# Patient Record
Sex: Female | Born: 1965 | Race: White | Hispanic: No | Smoking: Never smoker
Health system: Southern US, Community
[De-identification: ages and names within clinical notes are randomized; demographics above are authoritative.]

## PROBLEM LIST (undated history)

## (undated) DIAGNOSIS — T39395A Adverse effect of other nonsteroidal anti-inflammatory drugs [NSAID], initial encounter: Secondary | ICD-10-CM

## (undated) DIAGNOSIS — E669 Obesity, unspecified: Secondary | ICD-10-CM

## (undated) DIAGNOSIS — K259 Gastric ulcer, unspecified as acute or chronic, without hemorrhage or perforation: Secondary | ICD-10-CM

## (undated) DIAGNOSIS — N200 Calculus of kidney: Secondary | ICD-10-CM

## (undated) DIAGNOSIS — E039 Hypothyroidism, unspecified: Secondary | ICD-10-CM

## (undated) DIAGNOSIS — H9190 Unspecified hearing loss, unspecified ear: Secondary | ICD-10-CM

## (undated) DIAGNOSIS — K589 Irritable bowel syndrome without diarrhea: Secondary | ICD-10-CM

## (undated) DIAGNOSIS — Z974 Presence of external hearing-aid: Secondary | ICD-10-CM

## (undated) HISTORY — DX: Presence of external hearing-aid: Z97.4

## (undated) HISTORY — PX: TUBAL LIGATION: SHX77

## (undated) HISTORY — PX: INNER EAR SURGERY: SHX679

## (undated) HISTORY — DX: Obesity, unspecified: E66.9

## (undated) HISTORY — PX: CHOLECYSTECTOMY: SHX55

---

## 1999-07-04 ENCOUNTER — Emergency Department (HOSPITAL_COMMUNITY): Admission: EM | Admit: 1999-07-04 | Discharge: 1999-07-04 | Payer: Self-pay | Admitting: Emergency Medicine

## 1999-08-14 ENCOUNTER — Emergency Department (HOSPITAL_COMMUNITY): Admission: EM | Admit: 1999-08-14 | Discharge: 1999-08-14 | Payer: Self-pay | Admitting: Emergency Medicine

## 1999-10-23 ENCOUNTER — Encounter: Payer: Self-pay | Admitting: *Deleted

## 1999-10-23 ENCOUNTER — Emergency Department (HOSPITAL_COMMUNITY): Admission: EM | Admit: 1999-10-23 | Discharge: 1999-10-23 | Payer: Self-pay | Admitting: Emergency Medicine

## 2001-07-19 ENCOUNTER — Encounter: Payer: Self-pay | Admitting: Emergency Medicine

## 2001-07-19 ENCOUNTER — Emergency Department (HOSPITAL_COMMUNITY): Admission: EM | Admit: 2001-07-19 | Discharge: 2001-07-19 | Payer: Self-pay | Admitting: Emergency Medicine

## 2002-02-19 ENCOUNTER — Encounter: Payer: Self-pay | Admitting: Emergency Medicine

## 2002-02-19 ENCOUNTER — Emergency Department (HOSPITAL_COMMUNITY): Admission: EM | Admit: 2002-02-19 | Discharge: 2002-02-19 | Payer: Self-pay | Admitting: Emergency Medicine

## 2002-11-27 ENCOUNTER — Encounter: Payer: Self-pay | Admitting: Emergency Medicine

## 2002-11-28 ENCOUNTER — Inpatient Hospital Stay (HOSPITAL_COMMUNITY): Admission: EM | Admit: 2002-11-28 | Discharge: 2002-12-01 | Payer: Self-pay | Admitting: Emergency Medicine

## 2002-11-28 ENCOUNTER — Encounter: Payer: Self-pay | Admitting: Endocrinology

## 2002-11-30 ENCOUNTER — Encounter: Payer: Self-pay | Admitting: Internal Medicine

## 2002-12-19 ENCOUNTER — Emergency Department (HOSPITAL_COMMUNITY): Admission: EM | Admit: 2002-12-19 | Discharge: 2002-12-19 | Payer: Self-pay | Admitting: Emergency Medicine

## 2002-12-19 ENCOUNTER — Encounter: Payer: Self-pay | Admitting: Emergency Medicine

## 2003-02-13 ENCOUNTER — Emergency Department (HOSPITAL_COMMUNITY): Admission: EM | Admit: 2003-02-13 | Discharge: 2003-02-13 | Payer: Self-pay | Admitting: Emergency Medicine

## 2003-10-06 ENCOUNTER — Emergency Department (HOSPITAL_COMMUNITY): Admission: EM | Admit: 2003-10-06 | Discharge: 2003-10-07 | Payer: Self-pay | Admitting: Emergency Medicine

## 2004-02-02 ENCOUNTER — Observation Stay (HOSPITAL_COMMUNITY): Admission: EM | Admit: 2004-02-02 | Discharge: 2004-02-03 | Payer: Self-pay | Admitting: *Deleted

## 2005-01-07 ENCOUNTER — Emergency Department (HOSPITAL_COMMUNITY): Admission: EM | Admit: 2005-01-07 | Discharge: 2005-01-07 | Payer: Self-pay | Admitting: *Deleted

## 2005-08-14 ENCOUNTER — Ambulatory Visit: Payer: Self-pay

## 2008-08-13 ENCOUNTER — Ambulatory Visit: Payer: Self-pay | Admitting: Interventional Radiology

## 2008-08-13 ENCOUNTER — Emergency Department (HOSPITAL_BASED_OUTPATIENT_CLINIC_OR_DEPARTMENT_OTHER): Admission: EM | Admit: 2008-08-13 | Discharge: 2008-08-13 | Payer: Self-pay | Admitting: Emergency Medicine

## 2009-02-28 ENCOUNTER — Emergency Department (HOSPITAL_BASED_OUTPATIENT_CLINIC_OR_DEPARTMENT_OTHER): Admission: EM | Admit: 2009-02-28 | Discharge: 2009-02-28 | Payer: Self-pay | Admitting: Emergency Medicine

## 2009-02-28 ENCOUNTER — Ambulatory Visit: Payer: Self-pay | Admitting: Diagnostic Radiology

## 2010-10-01 ENCOUNTER — Encounter: Payer: Self-pay | Admitting: Orthopedic Surgery

## 2011-01-26 NOTE — H&P (Signed)
NAME:  Kelli Allison, Kelli Allison NO.:  [id number]   MEDICAL RECORD NO.:  [id number]                   PATIENT TYPE:  INP   LOCATION:  5725                                 FACILITY:  [facility]   PHYSICIAN:  Lemmie Evens, M.D.             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 for evaluation of difficulty voiding urine,  nausea, vomiting, and right flank pain of one to two days duration.   The patient, who has been followed by Dr. Carney Living in [facility], came to the emergency room for evaluation of the previously  mentioned complaints.  She did call her primary care physician a day ago who  prescribed Macrobid.  She did take one dose only.   In the emergency room this evening, the patient was found to have evidence  of pyuria on urinalysis.  Also the CBC did reveal a white cell count of  12,100.  Her temperature was 98.5 orally.  The patient has vomited at least  once in the last 24 hours.  She does complain of lower abdominal pain,  mainly in the suprapubic area.  In addition, she has had difficulty voiding  urine and had to be catheterized in the emergency room to obtain a specimen.   The patient denies headache, dizziness, blurred vision, chest pain, cough,  shortness of breath, recurrent diarrhea, peripheral numbness, or joint  discomfort.  She has had a longstanding history of constipation and does use  an enema as needed as well as a stool softener.  Also the patient has been  treated for migraine headaches. She does use amitriptyline 4 mg before bed.   The patient has undergone a tubal ligation procedure.  Also she has had C-  sections for delivery of two children.   Also she has undergone nine  operative procedures for correction of a hearing problem.   ALLERGIES:  The patient has had no significant allergy to medicine.   SOCIAL HISTORY:  The patient does not smoke cigarettes or drink alcohol.   PHYSICAL EXAMINATION:  GENERAL:  The patient appears to be acutely ill.  VITAL SIGNS:  Temperature is 98.5, blood pressure is 120/82, respirations  were 20.  EXTREMITIES:  Reveal good hand grip bilaterally.  There is no swelling of  the peripheral joints and she can move arms and legs well.  SKIN:  No rash in the trunk, face, or extremities.  HEENT:  Mouth exam reveals dryness of the buccal mucosa.  NECK:  No thyromegaly or adenopathy.  LUNGS:  Clear breath sounds bilaterally without rales.  HEART:  Regular sinus rhythm.  Heart  rate of 90.  No murmurs, rubs, or  gallops is appreciated.  ABDOMEN:  Good bowel sounds throughout.  There is tenderness on palpation in  the suprapubic area.  There is no tenderness in the left or right upper  quadrants of abdomen.  There is marked tenderness in the right CVA area of  the back.  On deep palpation, there is tenderness in the area of the right  kidney.    ASSESSMENT:  The patient has had a history of difficulty with urination,  vomiting, nausea, and suprapubic pain, as well as right flank pain for the  past 24 to 48 hours.  Urinalysis does reveal evidence of pyuria.  She is  being admitted for treatment of a pyelonephritis.  Because she has a seen a  physician outside the area, she is being admitted under unassigned service  to Dr. Georgianne Fick.                                               Lemmie Evens, M.D.    [place] Pink  D:  11/28/2002  T:  11/29/2002  Job:  [id number]

## 2011-01-26 NOTE — H&P (Signed)
NAME:  Kelli Allison, Kelli Allison                         ACCOUNT NO.:  [id number]   MEDICAL RECORD NO.:  [id number]                   PATIENT TYPE:  INP   LOCATION:  IC07                                 FACILITY:  [facility]   PHYSICIAN:  Tesfaye D. Felecia Shelling, M.D.              DATE OF BIRTH:  Jan 19, 1966   DATE OF ADMISSION:  02/02/2004  DATE OF DISCHARGE:                                HISTORY & PHYSICAL   CHIEF COMPLAINT:  Alcohol intoxication.   HISTORY OF PRESENT ILLNESS:  This is a 45 year old female patient who was  brought to the emergency room by [facility] police due to alcohol  intoxication.  Patient was drinking alcohol excessively, and she became  unresponsive.  She was brought to the emergency room, where she was  evaluated.  Patient had a blood alcohol level of 277.  Patient is currently  unresponsive and unable to give any history.  There is no further history  available.   REVIEW OF SYSTEMS:  Currently unresponsive and unable to give any history.   PAST MEDICAL HISTORY:  Reviewing her previous record, it shows that the  patient was admitted previously for urosepsis.  She has been treated in the  hospital with IV antibiotics.   CURRENT MEDICATIONS:  Patient is not on any known medications.   SOCIAL HISTORY:  No history is available at this time.   PHYSICAL EXAMINATION:  VITAL SIGNS:  Blood pressure 104/69, pulse 77,  respiratory rate 16, temperature 97.4 degrees Fahrenheit.  GENERAL:  Patient is deeply sleepy, and she is not responding to verbal  communication.  HEENT:  Pupils are equal and reactive.  NECK:  Supple.  LUNGS:  Clear lung fields.  Good air entry.  HEART:  First and second heart sounds heard.  No murmur or gallop.  ABDOMEN:  Soft and lax.  Bowel sounds are positive.  No mass or  hepatosplenomegaly.  EXTREMITIES:  No leg edema.   LABS:  WBC 8.2, hemoglobin 14.3, hematocrit 40.5, platelets 252.  Sodium  138, potassium 3.2, chloride 104, carbon dioxide 27, glucose 145,  BUN 8,  creatinine 0.7, calcium 8.0.  Alcohol 277.   ASSESSMENT:  1. Alcohol intoxication.  2. Mild hypokalemia.   PLAN:  Will keep the patient on close observation.  Continue IV fluids.  Will supplement potassium.  Will get behavioral health for patient  counseling and further management.     ___________________________________________                                         Eustaquio Maize. Felecia Shelling, M.D.   TDF/MEDQ  D:  02/02/2004  T:  02/02/2004  Job:  [id number]

## 2011-01-26 NOTE — Discharge Summary (Signed)
NAME:  Kelli Allison, Kelli Allison                         ACCOUNT NO.:  [id number]   MEDICAL RECORD NO.:  [id number]                   PATIENT TYPE:  INP   LOCATION:  5725                                 FACILITY:  [facility]   PHYSICIAN:  Georgianne Fick, M.D.            DATE OF BIRTH:  December 20, 1965   DATE OF ADMISSION:  11/28/2002  DATE OF DISCHARGE:  12/01/2002                                 DISCHARGE SUMMARY   ADMISSION DIAGNOSES:  1. Urinary tract infection.  2. Proteinuria.  3. Rule out pyelonephritis.  4. Abdominal pain of uncertain etiology.  5. Nausea and vomiting.   DISCHARGE DIAGNOSES:  1. Urinary tract infection.  2. No pyelonephritis.  3. Abdominal pain probably secondary to urinary tract infection, although     multiple tests have been negative for any other etiology.  The patient     did require some high doses of narcotics and we have reduced these     significantly as my concern is that of developing a dependence in this     patient whose symptoms do not truly match her objective findings.  We     will discharge her on only a minimal amounts of narcotics, to further     follow up with her primary M.D.   HOSPITAL COURSE:  After being admitted to [facility] with  complaints of severe nausea, vomiting and abdominal pain, the patient, who  was diagnosed with a urinary tract infection, was ruled out for  pyelonephritis by CAT scan as well as by ultrasound.  She was treated with  IV ciprofloxacin with improvement in her condition.  She presently has  minimal pain and tenderness and will thus be discharged.   During her stay, the nausea was controlled adequately with the use of  Phenergan as well as with the alteration of her diet.  Her pain was treated,  initially requiring morphine 2 mg IV every two hours; with this, the patient  obtained pain relief without significant sedation.  She was weaned off of  this and presently is stable and has been stable overnight with  just  Percocet and we will discharge her on Darvocet for home.  Her nausea is only  minor on full diet.   LABORATORY INVESTIGATIONS:  BMP revealed sodium of 140 with potassium of  3.4, BUN of 2, creatinine of 0.9.  CBC reveals white count of 5.5 with a  hemoglobin of 12.5, hematocrit of 35.7.  Urinalysis taken on admission  revealed wbc's that were too numerous to count, rbc's which were 3 to 6 per  high power field.  Urinalysis revealed greater than 300 protein, negative  nitrite, positive leukocytes and turbid urine.  Urine culture revealed E.  coli, sensitive to ampicillin, Cipro, cefazolin, gentamicin, Bactrim, and  fairly all other antibiotics were sensitive to it.   RADIOLOGICAL INVESTIGATIONS:  Abdominal x-ray taken on November 30, 2002  reveals nonobstructive bowel gas  pattern, no acute abdominal pathology,  right basilar atelectasis.   Ultrasound of the abdomen and kidneys reveals normal renal ultrasound.  There is no evidence of abscess.  This was taken on November 30, 2002.   CT of the pelvis taken on November 28, 2002 revealed normal abdominal  ultrasound and normal pelvic ultrasound.   Chest x-ray taken on November 28, 2002 revealed mild bronchitic changes and no  acute abnormalities.   DISCHARGE MEDICATIONS:  1. The patient will continue all previous pre-admission medications.  2. Cipro 500 mg p.o. b.i.d. for 10 more days.  3. Phenergan 25 mg one tablet p.o. p.r.n. q.4-6h.  4. Darvocet-N 100 one tablet p.o. p.r.n. q.6h. for pain.  5. Bowel regimen using stool softeners such as Colace with daily laxative     such as Metamucil.   DISCHARGE INSTRUCTIONS:  1. The patient should follow up with her primary M.D.  2. Low-fat diet.  3. Again, I want to reiterate that I am concerned that we will induce an     addiction in this individual and because of that concern, I have limited     her narcotic use.  She has no further refills on her Darvocet and she     will thus be turned over to  her primary physician.                                               Georgianne Fick, M.D.    AR/MEDQ  D:  12/01/2002  T:  12/02/2002  Job:  [id number]   cc:   Dr. Jyl Heinz

## 2011-05-08 ENCOUNTER — Emergency Department (HOSPITAL_BASED_OUTPATIENT_CLINIC_OR_DEPARTMENT_OTHER)
Admission: EM | Admit: 2011-05-08 | Discharge: 2011-05-08 | Disposition: A | Discharge status: Home / Self Care | Payer: 59 | Attending: Emergency Medicine | Admitting: Emergency Medicine

## 2011-05-08 DIAGNOSIS — R51 Headache: Secondary | ICD-10-CM

## 2011-05-08 MED ORDER — SUMATRIPTAN SUCCINATE 6 MG/0.5ML ~~LOC~~ SOLN
6.0000 mg | Freq: Once | SUBCUTANEOUS | Status: AC
  Administered 2011-05-08: 6 mg via SUBCUTANEOUS
  Filled 2011-05-08: qty 0.5

## 2011-05-08 MED ORDER — METOCLOPRAMIDE HCL 5 MG/ML IJ SOLN
10.0000 mg | Freq: Once | INTRAMUSCULAR | Status: AC
  Administered 2011-05-08: 10 mg via INTRAMUSCULAR
  Filled 2011-05-08: qty 2

## 2011-05-08 MED ORDER — DIPHENHYDRAMINE HCL 50 MG/ML IJ SOLN
25.0000 mg | Freq: Once | INTRAMUSCULAR | Status: AC
  Administered 2011-05-08: 25 mg via INTRAMUSCULAR
  Filled 2011-05-08: qty 1

## 2011-05-08 MED ORDER — KETOROLAC TROMETHAMINE 60 MG/2ML IM SOLN
60.0000 mg | Freq: Once | INTRAMUSCULAR | Status: AC
  Administered 2011-05-08: 60 mg via INTRAMUSCULAR
  Filled 2011-05-08: qty 2

## 2011-05-08 NOTE — ED Notes (Signed)
Verbalized pt. B/P to VP NP and Pt. Will be discharged at this time.

## 2011-05-08 NOTE — ED Notes (Signed)
C/o HA back of head, forehead and behind eyes-started last night

## 2011-05-08 NOTE — ED Provider Notes (Signed)
History     CSN: [id number] Arrival date & time: 05/08/2011  2:00 PM  Chief Complaint  Patient presents with  . Headache   HPI Comments: Pt states that she has a history of migraine and this won't go away:pt states that she has been under a lot of stress with her son at home  Patient is a 45 y.o. female presenting with headaches. The history is provided by the patient.  Headache  This is a recurrent problem. The current episode started yesterday. The problem occurs constantly. The problem has not changed since onset.The headache is associated with emotional stress. The pain is located in the occipital and frontal region. The quality of the pain is described as throbbing. The pain is moderate. The pain does not radiate. Pertinent negatives include no fever, no palpitations, no nausea and no vomiting. She has tried NSAIDs for the symptoms. The treatment provided no relief.    Past Medical History  Diagnosis Date  . Migraine     Past Surgical History  Procedure Date  . Inner ear surgery   . Cesarean section   . Cholecystectomy   . Tubal ligation     No family history on file.  History  Substance Use Topics  . Smoking status: Never Smoker   . Smokeless tobacco: Not on file  . Alcohol Use: Yes    OB History    Grav Para Term Preterm Abortions TAB SAB Ect Mult Living                  Review of Systems  Constitutional: Negative for fever.  Cardiovascular: Negative for palpitations.  Gastrointestinal: Negative for nausea and vomiting.  Neurological: Positive for headaches.  All other systems reviewed and are negative.    Physical Exam  BP 116/79  Pulse 68  Temp(Src) 98.4 F (36.9 C) (Oral)  Resp 16  SpO2 100%  Physical Exam  Nursing note and vitals reviewed. Constitutional: She is oriented to person, place, and time. She appears well-developed and well-nourished.  HENT:  Head: Normocephalic.  Right Ear: External ear normal.  Left Ear: External ear normal.    Eyes: Pupils are equal, round, and reactive to light.  Neck: Normal range of motion. Neck supple.  Cardiovascular: Normal rate and regular rhythm.   Pulmonary/Chest: Effort normal and breath sounds normal.  Musculoskeletal: Normal range of motion.  Neurological: She is alert and oriented to person, place, and time.  Skin: Skin is warm and dry.    ED Course  Procedures  MDM 3:13 PM Pt states it is a lot better in some areas but not others:will try imitrex as well: Pt is feeling better will send home      Teressa Lower, NP 05/08/11 1630

## 2011-05-10 NOTE — ED Provider Notes (Signed)
Medical screening examination/treatment/procedure(s) were performed by non-physician practitioner and as supervising physician I was immediately available for consultation/collaboration.  Gerhard Munch, MD, MA  Gerhard Munch, MD 05/10/11 [phone]

## 2011-11-15 ENCOUNTER — Ambulatory Visit
Admission: RE | Admit: 2011-11-15 | Discharge: 2011-11-15 | Disposition: A | Discharge status: Home / Self Care | Payer: 59 | Source: Ambulatory Visit | Attending: Family Medicine | Admitting: Family Medicine

## 2011-11-15 ENCOUNTER — Other Ambulatory Visit: Payer: Self-pay | Admitting: Family Medicine

## 2011-11-15 DIAGNOSIS — R1012 Left upper quadrant pain: Secondary | ICD-10-CM

## 2011-11-23 ENCOUNTER — Other Ambulatory Visit: Payer: Self-pay | Admitting: Family Medicine

## 2011-11-23 ENCOUNTER — Ambulatory Visit
Admission: RE | Admit: 2011-11-23 | Discharge: 2011-11-23 | Disposition: A | Discharge status: Home / Self Care | Payer: 59 | Source: Ambulatory Visit | Attending: Family Medicine | Admitting: Family Medicine

## 2011-11-23 DIAGNOSIS — R1030 Lower abdominal pain, unspecified: Secondary | ICD-10-CM

## 2011-11-23 DIAGNOSIS — M549 Dorsalgia, unspecified: Secondary | ICD-10-CM

## 2011-11-23 DIAGNOSIS — R109 Unspecified abdominal pain: Secondary | ICD-10-CM

## 2011-11-23 MED ORDER — IOHEXOL 300 MG/ML  SOLN
30.0000 mL | Freq: Once | INTRAMUSCULAR | Status: AC | PRN
  Administered 2011-11-23: 30 mL via ORAL

## 2011-11-23 MED ORDER — IOHEXOL 300 MG/ML  SOLN
100.0000 mL | Freq: Once | INTRAMUSCULAR | Status: AC | PRN
  Administered 2011-11-23: 100 mL via INTRAVENOUS

## 2012-02-13 ENCOUNTER — Encounter (HOSPITAL_COMMUNITY): Payer: Self-pay

## 2012-02-13 ENCOUNTER — Emergency Department (HOSPITAL_COMMUNITY): Payer: 59

## 2012-02-13 ENCOUNTER — Emergency Department (HOSPITAL_COMMUNITY)
Admission: EM | Admit: 2012-02-13 | Discharge: 2012-02-13 | Disposition: A | Discharge status: Home / Self Care | Payer: 59 | Attending: Emergency Medicine | Admitting: Emergency Medicine

## 2012-02-13 DIAGNOSIS — R42 Dizziness and giddiness: Secondary | ICD-10-CM | POA: Insufficient documentation

## 2012-02-13 DIAGNOSIS — R110 Nausea: Secondary | ICD-10-CM | POA: Insufficient documentation

## 2012-02-13 DIAGNOSIS — M542 Cervicalgia: Secondary | ICD-10-CM | POA: Insufficient documentation

## 2012-02-13 DIAGNOSIS — R51 Headache: Secondary | ICD-10-CM | POA: Insufficient documentation

## 2012-02-13 MED ORDER — GADOBENATE DIMEGLUMINE 529 MG/ML IV SOLN
15.0000 mL | Freq: Once | INTRAVENOUS | Status: AC
  Administered 2012-02-13: 15 mL via INTRAVENOUS

## 2012-02-13 MED ORDER — KETOROLAC TROMETHAMINE 30 MG/ML IJ SOLN
30.0000 mg | Freq: Once | INTRAMUSCULAR | Status: AC
  Administered 2012-02-13: 30 mg via INTRAVENOUS
  Filled 2012-02-13: qty 1

## 2012-02-13 MED ORDER — SODIUM CHLORIDE 0.9 % IV BOLUS (SEPSIS)
1000.0000 mL | Freq: Once | INTRAVENOUS | Status: AC
  Administered 2012-02-13: 1000 mL via INTRAVENOUS

## 2012-02-13 MED ORDER — METOCLOPRAMIDE HCL 5 MG/ML IJ SOLN
10.0000 mg | Freq: Once | INTRAMUSCULAR | Status: AC
  Administered 2012-02-13: 10 mg via INTRAVENOUS
  Filled 2012-02-13: qty 2

## 2012-02-13 MED ORDER — ONDANSETRON HCL 4 MG/2ML IJ SOLN
4.0000 mg | Freq: Once | INTRAMUSCULAR | Status: AC
  Administered 2012-02-13: 4 mg via INTRAVENOUS
  Filled 2012-02-13: qty 2
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
## 2012-02-13 NOTE — Discharge Instructions (Signed)
Headache, General, Unknown Cause Your testing today was negative for stroke.  Follow up with your doctor this week. Return to the ED if you develop new or worsening symptoms. The specific cause of your headache may not have been found today. There are many causes and types of headache. A few common ones are:  Tension headache.   Migraine.   Infections (examples: dental and sinus infections).   Bone and/or joint problems in the neck or jaw.   Depression.   Eye problems.  These headaches are not life threatening.  Headaches can sometimes be diagnosed by a patient history and a physical exam. Sometimes, lab and imaging studies (such as x-ray and/or CT scan) are used to rule out more serious problems. In some cases, a spinal tap (lumbar puncture) may be requested. There are many times when your exam and tests may be normal on the first visit even when there is a serious problem causing your headaches. Because of that, it is very important to follow up with your doctor or local clinic for further evaluation. FINDING OUT THE RESULTS OF TESTS  If a radiology test was performed, a radiologist will review your results.   You will be contacted by the emergency department or your physician if any test results require a change in your treatment plan.   Not all test results may be available during your visit. If your test results are not back during the visit, make an appointment with your caregiver to find out the results. Do not assume everything is normal if you have not heard from your caregiver or the medical facility. It is important for you to follow up on all of your test results.  HOME CARE INSTRUCTIONS   Keep follow-up appointments with your caregiver, or any specialist referral.   Only take over-the-counter or prescription medicines for pain, discomfort, or fever as directed by your caregiver.   Biofeedback, massage, or other relaxation techniques may be helpful.   Ice packs or heat  applied to the head and neck can be used. Do this three to four times per day, or as needed.   Call your doctor if you have any questions or concerns.   If you smoke, you should quit.  SEEK MEDICAL CARE IF:   You develop problems with medications prescribed.   You do not respond to or obtain relief from medications.   You have a change from the usual headache.   You develop nausea or vomiting.  SEEK IMMEDIATE MEDICAL CARE IF:   If your headache becomes severe.   You have an unexplained oral temperature above 102 F (38.9 C), or as your caregiver suggests.   You have a stiff neck.   You have loss of vision.   You have muscular weakness.   You have loss of muscular control.   You develop severe symptoms different from your first symptoms.   You start losing your balance or have trouble walking.   You feel faint or pass out.  MAKE SURE YOU:   Understand these instructions.   Will watch your condition.   Will get help right away if you are not doing well or get worse.  Document Released: 08/27/2005 Document Revised: 08/16/2011 Document Reviewed: 04/15/2008 [facility] Patient Information 2012 [place], [place].

## 2012-02-13 NOTE — ED Provider Notes (Signed)
History     CSN: [id number]  Arrival date & time 02/13/12  1814   First MD Initiated Contact with Patient 02/13/12 1940      Chief Complaint  Patient presents with  . Headache    (Consider location/radiation/quality/duration/timing/severity/associated sxs/prior treatment) HPI Comments: Patient reports 3 days of gradual onset headache that is posterior and radiates to the top of her head. Pain started dull and got more severe. She denies thunderclap onset. She has a history of migraines but this is different. She states she's felt nauseated and slept today Monday and Tuesday and does not remember these days. She denies any nausea, vomiting, photophobia. She has no chest pain, shortness of breath, fever or chills. She has problems walking with lightheadedness and dizziness. She denies spinning sensation or vertigo. She denies any speech problems, swallowing problems.  The history is provided by the patient.    Past Medical History  Diagnosis Date  . Migraine     Past Surgical History  Procedure Date  . Inner ear surgery   . Cesarean section   . Cholecystectomy   . Tubal ligation     No family history on file.  History  Substance Use Topics  . Smoking status: Never Smoker   . Smokeless tobacco: Not on file  . Alcohol Use: Yes     occassional    OB History    Grav Para Term Preterm Abortions TAB SAB Ect Mult Living                  Review of Systems  Constitutional: Positive for fever, activity change and appetite change.  HENT: Negative for congestion and rhinorrhea.   Eyes: Negative for photophobia and visual disturbance.  Respiratory: Negative for cough, chest tightness and shortness of breath.   Cardiovascular: Negative for chest pain.  Gastrointestinal: Positive for nausea and vomiting. Negative for abdominal pain.  Musculoskeletal: Negative for back pain.  Neurological: Positive for dizziness, light-headedness and headaches.    Allergies  Aspirin;  Caffeine; and Penicillins  Home Medications   Current Outpatient Rx  Name Route Sig Dispense Refill  . AMITRIPTYLINE HCL 50 MG PO TABS Oral Take 50 mg by mouth daily as needed. For head ache    . ADULT MULTIVITAMIN W/MINERALS CH Oral Take 1 tablet by mouth daily.    Marland Kitchen NAPROXEN SODIUM 220 MG PO TABS Oral Take by mouth as needed.      Marland Kitchen PAXIL PO Oral Take 0.5 tablets by mouth daily.    Marland Kitchen PROMETHAZINE HCL 25 MG RE SUPP Rectal Place 25 mg rectally every 6 (six) hours as needed. For nausea      BP 97/51  Pulse 61  Temp 98 F (36.7 C)  Resp 18  SpO2 97%  Physical Exam  Constitutional: She is oriented to person, place, and time. She appears well-developed and well-nourished. No distress.  HENT:  Head: Normocephalic and atraumatic.  Mouth/Throat: Oropharynx is clear and moist. No oropharyngeal exudate.       No nystagmus  Eyes: Conjunctivae and EOM are normal. Pupils are equal, round, and reactive to light.  Neck: Normal range of motion. Neck supple.       No meningism  Cardiovascular: Normal rate, regular rhythm and normal heart sounds.   No murmur heard. Pulmonary/Chest: Effort normal. No respiratory distress.  Abdominal: Soft. There is no tenderness. There is no rebound and no guarding.  Musculoskeletal: Normal range of motion. She exhibits no edema and no tenderness.  Neurological: She  is alert and oriented to person, place, and time. No cranial nerve deficit.       5 out of 5 strength throughout, no face asymmetry, no ataxia finger to nose. Marked ataxia with ambulation however. wide-based gait  markedly positive Romberg  Skin: Skin is warm.    ED Course  Procedures (including critical care time)  Labs Reviewed - No data to display Ct Head Wo Contrast  02/13/2012  *RADIOLOGY REPORT*  Clinical Data: Headache.  CT HEAD WITHOUT CONTRAST  Technique:  Contiguous axial images were obtained from the base of the skull through the vertex without contrast.  Comparison: Brain MRI  02/13/2012.  Head CT 08/13/2008.  Findings: No acute intracranial abnormality.  Specifically, no evidence of acute/subacute cerebral ischemia, no evidence of acute intracranial hemorrhage, no focal mass, mass effect, hydrocephalus or abnormal intra or extra-axial fluid collections.  No acute displaced skull fractures are identified.  The visualized paranasal sinuses are well pneumatized.  Mastoids are hypoplastic, but there does appear to be a small right mastoid effusion.  IMPRESSION: 1.  No acute intracranial abnormalities. 2.  The mastoids are hypoplastic bilaterally, but there does appear to be a small right-sided mastoid effusion.  Original Report Authenticated By: Florencia Reasons, M.D.   Mr Maxine Glenn Head Wo Contrast  02/13/2012  *RADIOLOGY REPORT*  Clinical Data:  46 year old female with headache.  Neck pain radiating to the head.  Nausea.  Comparison: Head CT from the same day and earlier.  MRI HEAD WITHOUT CONTRAST  Technique: Multiplanar, multiecho pulse sequences of the brain and surrounding structures were obtained according to standard protocol without intravenous contrast.  Findings:  No restricted diffusion to suggest acute infarction.  No midline shift, mass effect, evidence of mass lesion, ventriculomegaly, extra-axial collection or acute intracranial hemorrhage.  Cervicomedullary junction and pituitary are within normal limits.  Major intracranial vascular flow voids are preserved. Wallace Cullens and white matter signal is within normal limits for age throughout the brain.  Negative visualized cervical spine.  Visualized bone marrow signal is within normal limits.  The patient is status post bilateral mastoidectomies.  Paranasal sinuses are clear. Visualized orbit soft tissues are within normal limits. Negative scalp soft tissues.  IMPRESSION: 1. Normal noncontrast MRI appearance of the brain. 2.  Previous mastoidectomy. 3.  MRA findings are below.  MRA NECK WITHOUT AND WITH CONTRAST  Contrast: 15mL  MULTIHANCE GADOBENATE DIMEGLUMINE 529 MG/ML IV SOLN  Technique:  Angiographic images of the neck were obtained using MRA technique without and with intravenous contrast.  Carotid stenosis measurements (when applicable) are obtained utilizing NASCET criteria, using the distal internal carotid diameter as the denominator.  Findings:  Precontrast time-of-flight imaging reveals antegrade flow in both carotid and vertebral arteries in the neck.  Mild motion artifact.  Postcontrast imaging reveals a three-vessel arch configuration with normal great vessel origins.  Tortuous but otherwise normal right common carotid artery.  Widely patent right carotid bifurcation.  Right ICA origin and bulb are within normal limits.  Mildly tortuous cervical right ICA without stenosis.  Negative visualized right anterior circulation.  Tortuous but otherwise normal left common carotid artery.  Widely patent left carotid bifurcation, left ICA origin and bulb. Tortuous but otherwise negative left ICA.  Negative visualized left anterior circulation.  Codominant vertebral arteries.  Normal right vertebral artery origin.  Normal left vertebral artery origin aside from tortuosity. No vertebral artery stenosis in the neck.  Negative visualized posterior circulation.  IMPRESSION: 1.  Tortuous vessels, otherwise negative neck MRA. 2.  Intracranial MRA findings are below.  MRA HEAD WITHOUT CONTRAST  Technique: Angiographic images of the Circle of Willis were obtained using MRA technique without  intravenous contrast.  Findings:  Antegrade flow in the posterior circulation with codominant distal vertebral arteries.  Normal PICA vessels.  Normal vertebrobasilar junction.  Normal basilar artery, superior cerebellar arteries, and PCA origins.  Posterior communicating arteries are diminutive or absent.  Bilateral PCA branches are within normal limits.  Antegrade flow in both ICA siphons.  No ICA stenosis.  Normal right ophthalmic artery origin.  The left  ophthalmic artery origin has an unusually proximal takeoff from the anterior genu, and subsequently is tortuous, but otherwise appears to be within normal limits.  Normal carotid termini, MCA and ACA origins.  Diminutive anterior communicating artery.  Visualized ACA branches are within normal limits.  Visualized bilateral MCA branches are within normal limits.  IMPRESSION: 1.  Asymmetric left ophthalmic artery, appears to represent normal anatomic variation. 2. Otherwise negative intracranial MRA.  Original Report Authenticated By: Harley Hallmark, M.D.   Mr Angiogram Neck W Wo Contrast  02/13/2012  *RADIOLOGY REPORT*  Clinical Data:  46 year old female with headache.  Neck pain radiating to the head.  Nausea.  Comparison: Head CT from the same day and earlier.  MRI HEAD WITHOUT CONTRAST  Technique: Multiplanar, multiecho pulse sequences of the brain and surrounding structures were obtained according to standard protocol without intravenous contrast.  Findings:  No restricted diffusion to suggest acute infarction.  No midline shift, mass effect, evidence of mass lesion, ventriculomegaly, extra-axial collection or acute intracranial hemorrhage.  Cervicomedullary junction and pituitary are within normal limits.  Major intracranial vascular flow voids are preserved. Wallace Cullens and white matter signal is within normal limits for age throughout the brain.  Negative visualized cervical spine.  Visualized bone marrow signal is within normal limits.  The patient is status post bilateral mastoidectomies.  Paranasal sinuses are clear. Visualized orbit soft tissues are within normal limits. Negative scalp soft tissues.  IMPRESSION: 1. Normal noncontrast MRI appearance of the brain. 2.  Previous mastoidectomy. 3.  MRA findings are below.  MRA NECK WITHOUT AND WITH CONTRAST  Contrast: 15mL MULTIHANCE GADOBENATE DIMEGLUMINE 529 MG/ML IV SOLN  Technique:  Angiographic images of the neck were obtained using MRA technique without and  with intravenous contrast.  Carotid stenosis measurements (when applicable) are obtained utilizing NASCET criteria, using the distal internal carotid diameter as the denominator.  Findings:  Precontrast time-of-flight imaging reveals antegrade flow in both carotid and vertebral arteries in the neck.  Mild motion artifact.  Postcontrast imaging reveals a three-vessel arch configuration with normal great vessel origins.  Tortuous but otherwise normal right common carotid artery.  Widely patent right carotid bifurcation.  Right ICA origin and bulb are within normal limits.  Mildly tortuous cervical right ICA without stenosis.  Negative visualized right anterior circulation.  Tortuous but otherwise normal left common carotid artery.  Widely patent left carotid bifurcation, left ICA origin and bulb. Tortuous but otherwise negative left ICA.  Negative visualized left anterior circulation.  Codominant vertebral arteries.  Normal right vertebral artery origin.  Normal left vertebral artery origin aside from tortuosity. No vertebral artery stenosis in the neck.  Negative visualized posterior circulation.  IMPRESSION: 1.  Tortuous vessels, otherwise negative neck MRA. 2.  Intracranial MRA findings are below.  MRA HEAD WITHOUT CONTRAST  Technique: Angiographic images of the Circle of Willis were obtained using MRA technique without  intravenous contrast.  Findings:  Antegrade flow  in the posterior circulation with codominant distal vertebral arteries.  Normal PICA vessels.  Normal vertebrobasilar junction.  Normal basilar artery, superior cerebellar arteries, and PCA origins.  Posterior communicating arteries are diminutive or absent.  Bilateral PCA branches are within normal limits.  Antegrade flow in both ICA siphons.  No ICA stenosis.  Normal right ophthalmic artery origin.  The left ophthalmic artery origin has an unusually proximal takeoff from the anterior genu, and subsequently is tortuous, but otherwise appears to be  within normal limits.  Normal carotid termini, MCA and ACA origins.  Diminutive anterior communicating artery.  Visualized ACA branches are within normal limits.  Visualized bilateral MCA branches are within normal limits.  IMPRESSION: 1.  Asymmetric left ophthalmic artery, appears to represent normal anatomic variation. 2. Otherwise negative intracranial MRA.  Original Report Authenticated By: Harley Hallmark, M.D.   Mr Brain Wo Contrast  02/13/2012  *RADIOLOGY REPORT*  Clinical Data:  46 year old female with headache.  Neck pain radiating to the head.  Nausea.  Comparison: Head CT from the same day and earlier.  MRI HEAD WITHOUT CONTRAST  Technique: Multiplanar, multiecho pulse sequences of the brain and surrounding structures were obtained according to standard protocol without intravenous contrast.  Findings:  No restricted diffusion to suggest acute infarction.  No midline shift, mass effect, evidence of mass lesion, ventriculomegaly, extra-axial collection or acute intracranial hemorrhage.  Cervicomedullary junction and pituitary are within normal limits.  Major intracranial vascular flow voids are preserved. Wallace Cullens and white matter signal is within normal limits for age throughout the brain.  Negative visualized cervical spine.  Visualized bone marrow signal is within normal limits.  The patient is status post bilateral mastoidectomies.  Paranasal sinuses are clear. Visualized orbit soft tissues are within normal limits. Negative scalp soft tissues.  IMPRESSION: 1. Normal noncontrast MRI appearance of the brain. 2.  Previous mastoidectomy. 3.  MRA findings are below.  MRA NECK WITHOUT AND WITH CONTRAST  Contrast: 15mL MULTIHANCE GADOBENATE DIMEGLUMINE 529 MG/ML IV SOLN  Technique:  Angiographic images of the neck were obtained using MRA technique without and with intravenous contrast.  Carotid stenosis measurements (when applicable) are obtained utilizing NASCET criteria, using the distal internal carotid  diameter as the denominator.  Findings:  Precontrast time-of-flight imaging reveals antegrade flow in both carotid and vertebral arteries in the neck.  Mild motion artifact.  Postcontrast imaging reveals a three-vessel arch configuration with normal great vessel origins.  Tortuous but otherwise normal right common carotid artery.  Widely patent right carotid bifurcation.  Right ICA origin and bulb are within normal limits.  Mildly tortuous cervical right ICA without stenosis.  Negative visualized right anterior circulation.  Tortuous but otherwise normal left common carotid artery.  Widely patent left carotid bifurcation, left ICA origin and bulb. Tortuous but otherwise negative left ICA.  Negative visualized left anterior circulation.  Codominant vertebral arteries.  Normal right vertebral artery origin.  Normal left vertebral artery origin aside from tortuosity. No vertebral artery stenosis in the neck.  Negative visualized posterior circulation.  IMPRESSION: 1.  Tortuous vessels, otherwise negative neck MRA. 2.  Intracranial MRA findings are below.  MRA HEAD WITHOUT CONTRAST  Technique: Angiographic images of the Circle of Willis were obtained using MRA technique without  intravenous contrast.  Findings:  Antegrade flow in the posterior circulation with codominant distal vertebral arteries.  Normal PICA vessels.  Normal vertebrobasilar junction.  Normal basilar artery, superior cerebellar arteries, and PCA origins.  Posterior communicating arteries are diminutive or absent.  Bilateral PCA branches are within normal limits.  Antegrade flow in both ICA siphons.  No ICA stenosis.  Normal right ophthalmic artery origin.  The left ophthalmic artery origin has an unusually proximal takeoff from the anterior genu, and subsequently is tortuous, but otherwise appears to be within normal limits.  Normal carotid termini, MCA and ACA origins.  Diminutive anterior communicating artery.  Visualized ACA branches are within  normal limits.  Visualized bilateral MCA branches are within normal limits.  IMPRESSION: 1.  Asymmetric left ophthalmic artery, appears to represent normal anatomic variation. 2. Otherwise negative intracranial MRA.  Original Report Authenticated By: Ulla Potash III, M.D.     1. Headache       MDM  Gradual onset headache, different that previous migraines.  Associated with dizziness, vertigo, nausea. Denies thunderclap onset. nonfocal neuro exam except for +Romberg and ataxia with walking.  CT negative.  Headache cocktail. MRI to evaluate for posterior circulation infarct.  Reassuring MR results reviewed with patient.  Headache resolved as well as ataxia.     Glynn Octave, MD 02/14/12 1406

## 2012-02-13 NOTE — ED Notes (Signed)
Pt. Is dizzy when she ambulates

## 2012-02-13 NOTE — ED Notes (Signed)
Pt. Also reports having tightness in her posterior neck she describes the headache as a dull heartbeat

## 2012-02-13 NOTE — ED Notes (Signed)
Hold ketoralac until after CT

## 2012-02-13 NOTE — ED Notes (Signed)
Pt ambulated well with out difficulty and is drinking water well.

## 2012-02-13 NOTE — ED Notes (Signed)
Headache for 3 days  Nauseated.  Pt. Went to work today was unable to work Monday or Tuesday.    Pt. Slept Sunday Monday and Tuesday

## 2014-04-19 ENCOUNTER — Other Ambulatory Visit: Payer: Self-pay | Admitting: Family Medicine

## 2014-04-19 ENCOUNTER — Ambulatory Visit
Admission: RE | Admit: 2014-04-19 | Discharge: 2014-04-19 | Disposition: A | Discharge status: Home / Self Care | Payer: 59 | Source: Ambulatory Visit | Attending: Family Medicine | Admitting: Family Medicine

## 2014-04-19 DIAGNOSIS — M79672 Pain in left foot: Secondary | ICD-10-CM

## 2016-09-10 HISTORY — PX: COLONOSCOPY: SHX174

## 2016-09-10 HISTORY — PX: ESOPHAGOGASTRODUODENOSCOPY: SHX1529

## 2016-09-19 DIAGNOSIS — M5431 Sciatica, right side: Secondary | ICD-10-CM | POA: Diagnosis not present

## 2016-10-30 ENCOUNTER — Emergency Department (HOSPITAL_COMMUNITY)
Admission: EM | Admit: 2016-10-30 | Discharge: 2016-10-31 | Disposition: A | Discharge status: Home / Self Care | Payer: 59 | Attending: Emergency Medicine | Admitting: Emergency Medicine

## 2016-10-30 ENCOUNTER — Encounter (HOSPITAL_COMMUNITY): Payer: Self-pay

## 2016-10-30 DIAGNOSIS — R1011 Right upper quadrant pain: Secondary | ICD-10-CM | POA: Insufficient documentation

## 2016-10-30 DIAGNOSIS — Z79899 Other long term (current) drug therapy: Secondary | ICD-10-CM | POA: Insufficient documentation

## 2016-10-30 DIAGNOSIS — R1031 Right lower quadrant pain: Secondary | ICD-10-CM | POA: Diagnosis not present

## 2016-10-30 DIAGNOSIS — R109 Unspecified abdominal pain: Secondary | ICD-10-CM

## 2016-10-30 HISTORY — DX: Unspecified hearing loss, unspecified ear: H91.90

## 2016-10-30 LAB — URINALYSIS, ROUTINE W REFLEX MICROSCOPIC
Bilirubin Urine: NEGATIVE
Glucose, UA: NEGATIVE mg/dL
Hgb urine dipstick: NEGATIVE
KETONES UR: 5 mg/dL — AB
LEUKOCYTES UA: NEGATIVE
NITRITE: NEGATIVE
Protein, ur: NEGATIVE mg/dL
Specific Gravity, Urine: 1.023 (ref 1.005–1.030)
pH: 5 (ref 5.0–8.0)

## 2016-10-30 LAB — COMPREHENSIVE METABOLIC PANEL
ALT: 11 U/L — ABNORMAL LOW (ref 14–54)
ANION GAP: 10 (ref 5–15)
AST: 17 U/L (ref 15–41)
Albumin: 4.3 g/dL (ref 3.5–5.0)
Alkaline Phosphatase: 78 U/L (ref 38–126)
BILIRUBIN TOTAL: 0.8 mg/dL (ref 0.3–1.2)
BUN: 15 mg/dL (ref 6–20)
CO2: 25 mmol/L (ref 22–32)
Calcium: 9 mg/dL (ref 8.9–10.3)
Chloride: 102 mmol/L (ref 101–111)
Creatinine, Ser: 0.75 mg/dL (ref 0.44–1.00)
Glucose, Bld: 97 mg/dL (ref 65–99)
POTASSIUM: 3.7 mmol/L (ref 3.5–5.1)
Sodium: 137 mmol/L (ref 135–145)
TOTAL PROTEIN: 7.2 g/dL (ref 6.5–8.1)

## 2016-10-30 LAB — CBC
HCT: 44.6 % (ref 36.0–46.0)
HEMOGLOBIN: 15 g/dL (ref 12.0–15.0)
MCH: 30.7 pg (ref 26.0–34.0)
MCHC: 33.6 g/dL (ref 30.0–36.0)
MCV: 91.4 fL (ref 78.0–100.0)
Platelets: 246 10*3/uL (ref 150–400)
RBC: 4.88 MIL/uL (ref 3.87–5.11)
RDW: 14.1 % (ref 11.5–15.5)
WBC: 12.4 10*3/uL — AB (ref 4.0–10.5)

## 2016-10-30 LAB — LIPASE, BLOOD: LIPASE: 29 U/L (ref 11–51)

## 2016-10-30 MED ORDER — ONDANSETRON HCL 4 MG/2ML IJ SOLN
4.0000 mg | Freq: Once | INTRAMUSCULAR | Status: AC
  Administered 2016-10-31: 4 mg via INTRAVENOUS
  Filled 2016-10-30: qty 2

## 2016-10-30 MED ORDER — MORPHINE SULFATE (PF) 4 MG/ML IV SOLN
4.0000 mg | Freq: Once | INTRAVENOUS | Status: AC
  Administered 2016-10-31: 4 mg via INTRAVENOUS
  Filled 2016-10-30: qty 1

## 2016-10-30 NOTE — ED Triage Notes (Signed)
Pt states she has had abdominal pain x 1 week; pt states new onset of mid back pain yesterday with nausea; pt was seen at urgent care today who sent her to hospital for re-evaluation; pt states hx of removal of gall bladder; pt is URQ; pt states pain at 9/10 on arrival; a&ox  4 on arrival

## 2016-10-30 NOTE — ED Provider Notes (Signed)
[facility]-EMERGENCY DEPT Provider Note   CSN: [id number] Arrival date & time: 10/30/16  1924  By signing my name below, I, Kelli Allison, attest that this documentation has been prepared under the direction and in the presence of Shon Baton, MD.  Electronically Signed: [name], ED Scribe. 10/30/16. 11:55 PM.    History   Chief Complaint Chief Complaint  Patient presents with  . Abdominal Pain  . Back Pain   The history is provided by the patient. No language interpreter was used.   HPI Comments: Kelli Allison is a 51 y.o. female who presents to the Emergency Department complaining of 9/10, constant, stabbing, right sided abdominal pain x 1 week. She reports associated right lower back pain that radiates into her right hip that began yesterday, nausea, and decreased urine output. Pt was seen by her PCP and was told to come to the ED to rule out nephrolithiasis and appendicitis. Pt has a past abdominal surgical hx of cholecystectomy and notes having stabbing pain where her gallbladder used to be. She has taken aleve to modify her pain with temporary relief. She reports consuming ETOH heavily in the past but does not any longer. Pt is not a smoker. Pt denies hematuria, dysuria, fever, diarrhea, and vomiting.   Past Medical History:  Diagnosis Date  . Hearing loss   . Migraine     There are no active problems to display for this patient.   Past Surgical History:  Procedure Laterality Date  . CESAREAN SECTION    . CHOLECYSTECTOMY    . INNER EAR SURGERY    . TUBAL LIGATION      OB History    No data available       Home Medications    Prior to Admission medications   Medication Sig Start Date End Date Taking? Authorizing Provider  amitriptyline (ELAVIL) 50 MG tablet Take 50 mg by mouth daily as needed. For head ache    Historical Provider, MD  HYDROcodone-acetaminophen (NORCO/VICODIN) 5-325 MG tablet Take 1-2 tablets by mouth every 6 (six) hours as needed.  10/31/16   Shon Baton, MD  Multiple Vitamin (MULITIVITAMIN WITH MINERALS) TABS Take 1 tablet by mouth daily.    Historical Provider, MD  naproxen sodium (ANAPROX) 220 MG tablet Take by mouth as needed.      Historical Provider, MD  ondansetron (ZOFRAN ODT) 4 MG disintegrating tablet Take 1 tablet (4 mg total) by mouth every 8 (eight) hours as needed for nausea or vomiting. 10/31/16   Shon Baton, MD  PARoxetine HCl (PAXIL PO) Take 0.5 tablets by mouth daily.    Historical Provider, MD  promethazine (PHENERGAN) 25 MG suppository Place 25 mg rectally every 6 (six) hours as needed. For nausea    Historical Provider, MD    Family History No family history on file.  Social History Social History  Substance Use Topics  . Smoking status: Never Smoker  . Smokeless tobacco: Not on file  . Alcohol use Yes     Comment: occassional     Allergies   Aspirin; Caffeine; and Penicillins   Review of Systems Review of Systems  Constitutional: Negative for fever.  Gastrointestinal: Positive for abdominal pain and nausea. Negative for diarrhea and vomiting.  Genitourinary: Positive for decreased urine volume. Negative for dysuria, frequency and hematuria.  All other systems reviewed and are negative.    Physical Exam Updated Vital Signs BP 142/80 (BP Location: Left Arm)   Pulse 65   Temp  97.4 F (36.3 C) (Oral)   Resp 16   SpO2 97%   Physical Exam  Constitutional: She is oriented to person, place, and time. She appears well-developed and well-nourished. No distress.  Overweight  HENT:  Head: Normocephalic and atraumatic.  Cardiovascular: Normal rate, regular rhythm and normal heart sounds.   No murmur heard. Pulmonary/Chest: Effort normal and breath sounds normal. No respiratory distress. She has no wheezes.  Abdominal: Soft. Bowel sounds are normal. There is no tenderness. There is no guarding.  Genitourinary:  Genitourinary Comments: Right CVA tenderness    Musculoskeletal: She exhibits no edema.  Neurological: She is alert and oriented to person, place, and time.  Skin: Skin is warm and dry. No rash noted.  Psychiatric: She has a normal mood and affect.  Nursing note and vitals reviewed.    ED Treatments / Results  DIAGNOSTIC STUDIES: Oxygen Saturation is 100% on RA, normal by my interpretation.  COORDINATION OF CARE:  11:54 PM Discussed treatment plan with pt at bedside and pt agreed to plan.  Labs (all labs ordered are listed, but only abnormal results are displayed) Labs Reviewed  COMPREHENSIVE METABOLIC PANEL - Abnormal; Notable for the following:       Result Value   ALT 11 (*)    All other components within normal limits  CBC - Abnormal; Notable for the following:    WBC 12.4 (*)    All other components within normal limits  URINALYSIS, ROUTINE W REFLEX MICROSCOPIC - Abnormal; Notable for the following:    Ketones, ur 5 (*)    All other components within normal limits  LIPASE, BLOOD    EKG  EKG Interpretation None       Radiology Ct Renal Stone Study  Result Date: 10/31/2016 CLINICAL DATA:  51 year old female with right flank pain. EXAM: CT ABDOMEN AND PELVIS WITHOUT CONTRAST TECHNIQUE: Multidetector CT imaging of the abdomen and pelvis was performed following the standard protocol without IV contrast. COMPARISON:  CT dated 11/23/2011 FINDINGS: Evaluation of this exam is limited in the absence of intravenous contrast. Lower chest: No acute abnormality. Hepatobiliary: Cholecystectomy. The liver is unremarkable. No intrahepatic biliary ductal dilatation. Pancreas: Unremarkable. No pancreatic ductal dilatation or surrounding inflammatory changes. Spleen: Normal in size without focal abnormality. Adrenals/Urinary Tract: A 1.4 cm left adrenal adenoma. The right adrenal gland appears unremarkable. There is a 3 mm nonobstructing right renal interpolar stone. No hydronephrosis. The left kidney is unremarkable. The visualized  ureters and urinary bladder appear unremarkable. Stomach/Bowel: Stomach is within normal limits. Appendix appears normal. No evidence of bowel wall thickening, distention, or inflammatory changes. Vascular/Lymphatic: The abdominal aorta and IVC are grossly unremarkable on this noncontrast study. No portal venous gas identified. There is no adenopathy. Reproductive: The uterus is anteverted and grossly unremarkable. The ovaries appear unremarkable as well. A surgical clip noted along the left pelvic sidewall. Other: Small fat containing umbilical hernia. Musculoskeletal: No acute or significant osseous findings. IMPRESSION: A 3 mm nonobstructing right renal stone. No hydronephrosis. No other acute intra-abdominal or pelvic pathology identified. Electronically Signed   By: Elgie CollardArash  Radparvar M.D.   On: 10/31/2016 00:32    Procedures Procedures (including critical care time)  Medications Ordered in ED Medications  morphine 4 MG/ML injection 4 mg (4 mg Intravenous Given 10/31/16 0057)  ondansetron (ZOFRAN) injection 4 mg (4 mg Intravenous Given 10/31/16 0058)  ketorolac (TORADOL) 15 MG/ML injection 15 mg (15 mg Intravenous Given 10/31/16 0159)     Initial Impression / Assessment and  Plan / ED Course  I have reviewed the triage vital signs and the nursing notes.  Pertinent labs & imaging results that were available during my care of the patient were reviewed by me and considered in my medical decision making (see chart for details).     Patient presents with right flank and abdominal pain. She is nontoxic. Vital signs reassuring. Initial lab work is reassuring. Mild leukocytosis of unclear clinical significance. Denies fevers. Description of pain is somewhat suspicious for kidney stones. She is otherwise well-appearing. She does have reproducible pain over the right flank. No rash to suggest shingles. CT scan obtained and is negative for acute process. They comment normal appendix. She has nonobstructing  kidney stones. Discussed this with the patient. Supportive measures at home and follow-up with primary physician if symptoms worsen.  After history, exam, and medical workup I feel the patient has been appropriately medically screened and is safe for discharge home. Pertinent diagnoses were discussed with the patient. Patient was given return precautions.   Final Clinical Impressions(s) / ED Diagnoses   Final diagnoses:  Flank pain   I personally performed the services described in this documentation, which was scribed in my presence. The recorded information has been reviewed and is accurate.  New Prescriptions New Prescriptions   HYDROCODONE-ACETAMINOPHEN (NORCO/VICODIN) 5-325 MG TABLET    Take 1-2 tablets by mouth every 6 (six) hours as needed.   ONDANSETRON (ZOFRAN ODT) 4 MG DISINTEGRATING TABLET    Take 1 tablet (4 mg total) by mouth every 8 (eight) hours as needed for nausea or vomiting.     Shon Baton, MD 10/31/16 [phone]

## 2016-10-31 ENCOUNTER — Emergency Department (HOSPITAL_COMMUNITY): Payer: 59

## 2016-10-31 DIAGNOSIS — R109 Unspecified abdominal pain: Secondary | ICD-10-CM | POA: Diagnosis not present

## 2016-10-31 MED ORDER — HYDROCODONE-ACETAMINOPHEN 5-325 MG PO TABS: 1.0000 | ORAL_TABLET | Freq: Four times a day (QID) | ORAL | 0 refills | Status: DC | PRN

## 2016-10-31 MED ORDER — KETOROLAC TROMETHAMINE 15 MG/ML IJ SOLN
15.0000 mg | Freq: Once | INTRAMUSCULAR | Status: AC
  Administered 2016-10-31: 15 mg via INTRAVENOUS
  Filled 2016-10-31: qty 1

## 2016-10-31 MED ORDER — ONDANSETRON 4 MG PO TBDP: 4.0000 mg | ORAL_TABLET | Freq: Three times a day (TID) | ORAL | 0 refills | Status: DC | PRN

## 2016-10-31 NOTE — Discharge Instructions (Signed)
You were seen today for right-sided pain and back pain. Your workup is largely reassuring. You do have kidney stones but there is no evidence of an obstructing stone. You may have recently passed a stone. Otherwise her CT scan was reassuring. You supportive measures at home including pain and nausea medication. If you have any new or worsening symptoms she should be reevaluated.

## 2016-10-31 NOTE — ED Notes (Signed)
Gave pt ice water and apple juice, per Jazmine - RN.

## 2016-11-01 ENCOUNTER — Encounter (HOSPITAL_COMMUNITY): Payer: Self-pay | Admitting: Emergency Medicine

## 2016-11-01 ENCOUNTER — Emergency Department (HOSPITAL_COMMUNITY)
Admission: EM | Admit: 2016-11-01 | Discharge: 2016-11-01 | Disposition: A | Discharge status: Home / Self Care | Payer: 59 | Attending: Emergency Medicine | Admitting: Emergency Medicine

## 2016-11-01 DIAGNOSIS — M545 Low back pain, unspecified: Secondary | ICD-10-CM

## 2016-11-01 DIAGNOSIS — R109 Unspecified abdominal pain: Secondary | ICD-10-CM | POA: Diagnosis not present

## 2016-11-01 LAB — WET PREP, GENITAL
CLUE CELLS WET PREP: NONE SEEN
Sperm: NONE SEEN
Trich, Wet Prep: NONE SEEN
YEAST WET PREP: NONE SEEN

## 2016-11-01 LAB — BASIC METABOLIC PANEL WITH GFR
Anion gap: 8 (ref 5–15)
BUN: 11 mg/dL (ref 6–20)
CO2: 26 mmol/L (ref 22–32)
Calcium: 8.9 mg/dL (ref 8.9–10.3)
Chloride: 103 mmol/L (ref 101–111)
Creatinine, Ser: 0.88 mg/dL (ref 0.44–1.00)
GFR calc Af Amer: >60 mL/min
GFR calc non Af Amer: >60 mL/min
Glucose, Bld: 93 mg/dL (ref 65–99)
Potassium: 4.3 mmol/L (ref 3.5–5.1)
Sodium: 137 mmol/L (ref 135–145)

## 2016-11-01 LAB — URINALYSIS, ROUTINE W REFLEX MICROSCOPIC
Bilirubin Urine: NEGATIVE
Glucose, UA: NEGATIVE mg/dL
Hgb urine dipstick: NEGATIVE
Ketones, ur: 5 mg/dL — AB
Leukocytes, UA: NEGATIVE
Nitrite: NEGATIVE
Protein, ur: NEGATIVE mg/dL
Specific Gravity, Urine: 1.023 (ref 1.005–1.030)
pH: 5 (ref 5.0–8.0)

## 2016-11-01 LAB — CBC
HCT: 41.9 % (ref 36.0–46.0)
Hemoglobin: 13.9 g/dL (ref 12.0–15.0)
MCH: 30.5 pg (ref 26.0–34.0)
MCHC: 33.2 g/dL (ref 30.0–36.0)
MCV: 92.1 fL (ref 78.0–100.0)
PLATELETS: 222 10*3/uL (ref 150–400)
RBC: 4.55 MIL/uL (ref 3.87–5.11)
RDW: 14.1 % (ref 11.5–15.5)
WBC: 10 10*3/uL (ref 4.0–10.5)

## 2016-11-01 MED ORDER — DEXAMETHASONE SODIUM PHOSPHATE 10 MG/ML IJ SOLN
10.0000 mg | Freq: Once | INTRAMUSCULAR | Status: AC
  Administered 2016-11-01: 10 mg via INTRAVENOUS
  Filled 2016-11-01: qty 1

## 2016-11-01 MED ORDER — ORPHENADRINE CITRATE ER 100 MG PO TB12
100.0000 mg | ORAL_TABLET | Freq: Two times a day (BID) | ORAL | Status: DC
  Administered 2016-11-01: 100 mg via ORAL
  Filled 2016-11-01: qty 1

## 2016-11-01 MED ORDER — ORPHENADRINE CITRATE ER 100 MG PO TB12: 100.0000 mg | ORAL_TABLET | Freq: Two times a day (BID) | ORAL | 0 refills | Status: DC

## 2016-11-01 MED ORDER — ORPHENADRINE CITRATE 30 MG/ML IJ SOLN: 60.0000 mg | Freq: Two times a day (BID) | INTRAMUSCULAR | Status: DC

## 2016-11-01 MED ORDER — METHYLPREDNISOLONE 4 MG PO TBPK: ORAL_TABLET | ORAL | 0 refills | Status: DC

## 2016-11-01 MED ORDER — KETOROLAC TROMETHAMINE 30 MG/ML IJ SOLN
30.0000 mg | Freq: Once | INTRAMUSCULAR | Status: AC
  Administered 2016-11-01: 30 mg via INTRAVENOUS
  Filled 2016-11-01: qty 1

## 2016-11-01 NOTE — ED Provider Notes (Signed)
[facility]-EMERGENCY DEPT Provider Note   CSN: [id number] Arrival date & time: 11/01/16  1407     History   Chief Complaint Chief Complaint  Patient presents with  . Nephrolithiasis    HPI Kelli Allison is a 51 y.o. female.  HPI Patient has had right sided back and lower abdominal pain for over a week. She reports it has been constant and progressive. She had been seen in the emergency department and given Vicodin for pain control. She reports medications are not helping. The pain is sharp and she indicates it starting in her right lower back above the iliac crest area. It is made much worse by certain movements. She has felt nauseated but not had any episodes of vomiting. No fever. No abnormal vaginal discharge or bleeding. No cough or shortness of breath. Patient was referred back to the emergency department as she is not getting pain control on her medications. She reports she does get annual pelvic examinations and has not had any history of ovarian cysts or abnormal Pap exams. She denies problems with significant back pain or prior injury. Past Medical History:  Diagnosis Date  . Hearing loss   . Migraine     There are no active problems to display for this patient.   Past Surgical History:  Procedure Laterality Date  . CESAREAN SECTION    . CHOLECYSTECTOMY    . INNER EAR SURGERY    . TUBAL LIGATION      OB History    No data available       Home Medications    Prior to Admission medications   Medication Sig Start Date End Date Taking? Authorizing Provider  amitriptyline (ELAVIL) 50 MG tablet Take 50 mg by mouth daily as needed. For head ache   Yes Historical Provider, MD  fluticasone (FLONASE) 50 MCG/ACT nasal spray Place 1 spray into both nostrils daily.   Yes Historical Provider, MD  Multiple Vitamin (MULITIVITAMIN WITH MINERALS) TABS Take 1 tablet by mouth daily.   Yes Historical Provider, MD  HYDROcodone-acetaminophen (NORCO/VICODIN) 5-325 MG tablet Take 1-2  tablets by mouth every 6 (six) hours as needed. Patient not taking: Reported on 11/01/2016 10/31/16   Shon Batonourtney F Horton, MD  methylPREDNISolone (MEDROL DOSEPAK) 4 MG TBPK tablet As per dose pack instructions 11/01/16   Arby BarretteMarcy Tyrome Donatelli, MD  ondansetron (ZOFRAN ODT) 4 MG disintegrating tablet Take 1 tablet (4 mg total) by mouth every 8 (eight) hours as needed for nausea or vomiting. Patient not taking: Reported on 11/01/2016 10/31/16   Shon Batonourtney F Horton, MD  orphenadrine (NORFLEX) 100 MG tablet Take 1 tablet (100 mg total) by mouth 2 (two) times daily. 11/01/16   Arby BarretteMarcy Roschelle Calandra, MD    Family History No family history on file.  Social History Social History  Substance Use Topics  . Smoking status: Never Smoker  . Smokeless tobacco: Not on file  . Alcohol use Yes     Comment: occassional     Allergies   Aspirin; Caffeine; and Penicillins   Review of Systems Review of Systems 10 Systems reviewed and are negative for acute change except as noted in the HPI.   Physical Exam Updated Vital Signs BP 100/74   Pulse (!) 57   Temp 98.1 F (36.7 C) (Oral)   Resp 16   SpO2 99%   Physical Exam  Constitutional: She is oriented to person, place, and time.  Patient is alert and nontoxic. She appears to be in moderate pain. No respiratory distress.  HENT:  Head: Normocephalic and atraumatic.  Eyes: EOM are normal.  Neck: Neck supple.  Cardiovascular: Normal rate, regular rhythm, normal heart sounds and intact distal pulses.   Pulmonary/Chest: Effort normal and breath sounds normal.  Abdominal: Soft. Bowel sounds are normal. She exhibits no distension. There is no tenderness.  Abdomen is not significant tender to palpation. Patient does have significantly reproducible back tenderness to palpation. No groin mass or fullness.  Genitourinary:  Genitourinary Comments: Normal external female genitalia. Speculum examination normal. No cervical friability or discharge. Bimanual examination no  cervical motion tenderness. No adnexal tenderness or uterine tenderness.  Musculoskeletal:  Patient winced and endorsed significant pain to palpation of the paraspinous muscle bodies on the right lower back and of the muscle body superior to the iliac crest posteriorly. There were no appreciable soft tissue abnormalities palpation however this elicited significant pain with even mild to moderate palpation. Pain was also reproducible with region with sitting up and forward flexion. Any twisting or bending motion exacerbated. No calf tenderness or peripheral edema.  Neurological: She is alert and oriented to person, place, and time. No cranial nerve deficit. She exhibits normal muscle tone. Coordination normal.  Patient has normal gait. Normal lower extremity function.  Skin: Skin is warm and dry.  Psychiatric: She has a normal mood and affect.     ED Treatments / Results  Labs (all labs ordered are listed, but only abnormal results are displayed) Labs Reviewed  URINALYSIS, ROUTINE W REFLEX MICROSCOPIC - Abnormal; Notable for the following:       Result Value   Ketones, ur 5 (*)    All other components within normal limits  WET PREP, GENITAL  BASIC METABOLIC PANEL  CBC  GC/CHLAMYDIA PROBE AMP ([place]) NOT AT [facility]    EKG  EKG Interpretation None       Radiology Ct Renal Stone Study  Result Date: 10/31/2016 CLINICAL DATA:  51 year old female with right flank pain. EXAM: CT ABDOMEN AND PELVIS WITHOUT CONTRAST TECHNIQUE: Multidetector CT imaging of the abdomen and pelvis was performed following the standard protocol without IV contrast. COMPARISON:  CT dated 11/23/2011 FINDINGS: Evaluation of this exam is limited in the absence of intravenous contrast. Lower chest: No acute abnormality. Hepatobiliary: Cholecystectomy. The liver is unremarkable. No intrahepatic biliary ductal dilatation. Pancreas: Unremarkable. No pancreatic ductal dilatation or surrounding inflammatory changes.  Spleen: Normal in size without focal abnormality. Adrenals/Urinary Tract: A 1.4 cm left adrenal adenoma. The right adrenal gland appears unremarkable. There is a 3 mm nonobstructing right renal interpolar stone. No hydronephrosis. The left kidney is unremarkable. The visualized ureters and urinary bladder appear unremarkable. Stomach/Bowel: Stomach is within normal limits. Appendix appears normal. No evidence of bowel wall thickening, distention, or inflammatory changes. Vascular/Lymphatic: The abdominal aorta and IVC are grossly unremarkable on this noncontrast study. No portal venous gas identified. There is no adenopathy. Reproductive: The uterus is anteverted and grossly unremarkable. The ovaries appear unremarkable as well. A surgical clip noted along the left pelvic sidewall. Other: Small fat containing umbilical hernia. Musculoskeletal: No acute or significant osseous findings. IMPRESSION: A 3 mm nonobstructing right renal stone. No hydronephrosis. No other acute intra-abdominal or pelvic pathology identified. Electronically Signed   By: Elgie Collard M.D.   On: 10/31/2016 00:32    Procedures Procedures (including critical care time)  Medications Ordered in ED Medications  orphenadrine (NORFLEX) 12 hr tablet 100 mg (100 mg Oral Given 11/01/16 1904)  ketorolac (TORADOL) 30 MG/ML injection 30 mg (30 mg  Intravenous Given 11/01/16 1818)  dexamethasone (DECADRON) injection 10 mg (10 mg Intravenous Given 11/01/16 1904)     Initial Impression / Assessment and Plan / ED Course  I have reviewed the triage vital signs and the nursing notes.  Pertinent labs & imaging results that were available during my care of the patient were reviewed by me and considered in my medical decision making (see chart for details).       Final Clinical Impressions(s) / ED Diagnoses   Final diagnoses:  Acute right-sided low back pain without sciatica   Patient has persistent pain. At this time, physical  examination was highly suggestive of musculoskeletal lower back pain. Patient expressed significant pain with palpation of soft tissues of the lower back as well as flexion and twisting motions. There is no neurologic dysfunction. Abdominal examination did not have reproducible tenderness and pelvic examination was normal without tenderness. At this point I significantly doubt intra-abdominal etiology. Patient's CT yesterday showed a intraparenchymal stone on the right kidney which would not be consistent with the patient's pain. Plan at this time will be to treat with dose of Decadron in the emergency department and a Medrol Dosepak with muscle relaxers. She is instructed to follow-up with her family doctor. New Prescriptions New Prescriptions   METHYLPREDNISOLONE (MEDROL DOSEPAK) 4 MG TBPK TABLET    As per dose pack instructions   ORPHENADRINE (NORFLEX) 100 MG TABLET    Take 1 tablet (100 mg total) by mouth 2 (two) times daily.     Arby Barrette, MD 11/01/16 [phone]

## 2016-11-01 NOTE — ED Notes (Signed)
ED Provider at bedside. 

## 2016-11-01 NOTE — ED Triage Notes (Signed)
Pt reports she was seen here a few days ago, dx with kidney stones, states pain meds she was given are not helping, could not get in with urologist for a few weeks, pcp recommended she come to ED for pain management.

## 2016-11-02 LAB — GC/CHLAMYDIA PROBE AMP (~~LOC~~) NOT AT ARMC
CHLAMYDIA, DNA PROBE: NEGATIVE
NEISSERIA GONORRHEA: NEGATIVE

## 2016-11-14 DIAGNOSIS — R1031 Right lower quadrant pain: Secondary | ICD-10-CM | POA: Diagnosis not present

## 2016-11-14 DIAGNOSIS — R109 Unspecified abdominal pain: Secondary | ICD-10-CM | POA: Diagnosis not present

## 2016-12-11 DIAGNOSIS — N200 Calculus of kidney: Secondary | ICD-10-CM | POA: Diagnosis not present

## 2016-12-11 DIAGNOSIS — R109 Unspecified abdominal pain: Secondary | ICD-10-CM | POA: Diagnosis not present

## 2016-12-31 ENCOUNTER — Ambulatory Visit (INDEPENDENT_AMBULATORY_CARE_PROVIDER_SITE_OTHER): Payer: 59 | Admitting: Family Medicine

## 2016-12-31 ENCOUNTER — Encounter: Payer: Self-pay | Admitting: Family Medicine

## 2016-12-31 VITALS — BP 115/63 | HR 54 | Temp 97.5°F | Ht 61.0 in | Wt 197.0 lb

## 2016-12-31 DIAGNOSIS — R51 Headache: Secondary | ICD-10-CM

## 2016-12-31 DIAGNOSIS — J309 Allergic rhinitis, unspecified: Secondary | ICD-10-CM | POA: Diagnosis not present

## 2016-12-31 DIAGNOSIS — R198 Other specified symptoms and signs involving the digestive system and abdomen: Secondary | ICD-10-CM | POA: Diagnosis not present

## 2016-12-31 DIAGNOSIS — R109 Unspecified abdominal pain: Secondary | ICD-10-CM

## 2016-12-31 DIAGNOSIS — R519 Headache, unspecified: Secondary | ICD-10-CM | POA: Insufficient documentation

## 2016-12-31 DIAGNOSIS — R197 Diarrhea, unspecified: Secondary | ICD-10-CM | POA: Diagnosis not present

## 2016-12-31 DIAGNOSIS — R1084 Generalized abdominal pain: Secondary | ICD-10-CM

## 2016-12-31 MED ORDER — HYOSCYAMINE SULFATE 0.125 MG PO TBDP: 0.1250 mg | ORAL_TABLET | ORAL | 2 refills | Status: DC | PRN

## 2016-12-31 NOTE — Progress Notes (Signed)
Subjective:  Patient ID: Kelli Allison, female    DOB: 04-27-1966  Age: 51 y.o. MRN: [id number]  CC: New Patient (Initial Visit) (pt here today c/o abdominal pain with diarrhea)   HPI Kelli Allison presents for Lifelong problems with her bowels. Over the last 6 months she has had increasing problems with severe tenesmus and epigastric pain with bowel movements. She has had a history of chronic constipation in the past. This was treated 10 years ago with caffeine avoidance. This helped with relieving the constipation and bloating for the last 10-15 years. This time she is having diarrhea multiple bowel movements a day for the last 4 days. She's had episodes of this roughly every 3 months over the last couple of years. This episode seems to be worst. It is associated with a decreased appetite posterior headache and fatigue. Symptoms are increasing. Of note is that 1-2 months ago she was diagnosed with nephrolithiasis. She is also had a cholecystectomy in the remote past. Her colonoscopy done about 10 years ago showed colon polyps these were biopsied and found to be benign. She has not had follow-up with GI in many years. She has been seeing urgent care through [facility] for her recent conditions. She has never been tested for gluten or try to gluten-free diet. She is trying to watch what she eats and avoid caffeine. She denies having had any workup with blood or CT for her GI tract.  History Kelli Allison has a past medical history of Hearing loss and Migraine.   She has a past surgical history that includes Inner ear surgery; Cesarean section; Cholecystectomy; and Tubal ligation.   Her family history includes Alcohol abuse in her mother; Asthma in her maternal grandfather and mother; Birth defects in her brother and son; COPD in her maternal grandfather and mother; Cancer in her paternal grandmother; Depression in her brother, brother, daughter, and mother; Diabetes in her maternal grandmother; Drug abuse  in her mother; Heart disease in her maternal grandmother; Hyperlipidemia in her maternal grandmother; Hypertension in her maternal grandmother; Kidney disease in her son; Learning disabilities in her brother and brother; Miscarriages / [place] in her daughter, maternal grandmother, and mother.She reports that she has never smoked. She has never used smokeless tobacco. She reports that she drinks alcohol. She reports that she does not use drugs.  Current Outpatient Prescriptions on File Prior to Visit  Medication Sig Dispense Refill  . fluticasone (FLONASE) 50 MCG/ACT nasal spray Place 1 spray into both nostrils daily.    . Multiple Vitamin (MULITIVITAMIN WITH MINERALS) TABS Take 1 tablet by mouth daily.    . ondansetron (ZOFRAN ODT) 4 MG disintegrating tablet Take 1 tablet (4 mg total) by mouth every 8 (eight) hours as needed for nausea or vomiting. (Patient not taking: Reported on 11/01/2016) 20 tablet 0   No current facility-administered medications on file prior to visit.     ROS Review of Systems  Constitutional: Positive for fatigue. Negative for fever.  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 Negative for congestion, rhinorrhea and sore throat.   Eyes: Negative for photophobia and visual disturbance.  Respiratory: Negative for cough and shortness of breath.   Cardiovascular: Negative for chest pain and palpitations.  Gastrointestinal: Positive for abdominal distention, abdominal pain (focused in epigastrum) and diarrhea. Negative for anal bleeding, blood in stool, nausea and vomiting.  Endocrine: Negative for cold intolerance, heat intolerance and polyphagia.  Genitourinary: Negative for dysuria and frequency.  Musculoskeletal: Negative for arthralgias and myalgias.  Neurological: Positive for headaches (posterior dull ache onset with recent bout of diarrhea). Negative for dizziness and light-headedness.    Hematological: Negative for adenopathy. Does not bruise/bleed easily.  Psychiatric/Behavioral: Negative for agitation, confusion, dysphoric mood and sleep disturbance. The patient is not nervous/anxious.     Objective:  BP 115/63   Pulse (!) 54   Temp 97.5 F (36.4 C) (Oral)   Ht '5\' 1"'$  (1.549 m)   Wt 197 lb (89.4 kg)   BMI 37.22 kg/m   Physical Exam  Constitutional: She is oriented to person, place, and time. She appears well-developed and well-nourished. No distress.  HENT:  Head: Normocephalic and atraumatic.  Right Ear: External ear normal. No middle ear effusion. Decreased hearing is noted.  Left Ear: External ear normal.  No middle ear effusion. Decreased hearing is noted.  Nose: Nose normal.  Mouth/Throat: Oropharynx is clear and moist.  Aides in place. Hearing near normal for conversation in quiet environment.   Eyes: Conjunctivae and EOM are normal. Pupils are equal, round, and reactive to light.  Neck: Normal range of motion. Neck supple. No thyromegaly present.  Cardiovascular: Normal rate, regular rhythm and normal heart sounds.   No murmur heard. Pulmonary/Chest: Effort normal and breath sounds normal. No respiratory distress. She has no wheezes. She has no rales.  Abdominal: Soft. Bowel sounds are normal. She exhibits no distension and no mass. There is tenderness. There is no rebound and no guarding.  Lymphadenopathy:    She has no cervical adenopathy.  Neurological: She is alert and oriented to person, place, and time. She has normal reflexes.  Skin: Skin is warm and dry.  Psychiatric: She has a normal mood and affect. Her behavior is normal. Judgment and thought content normal.    Assessment & Plan:   Tonjua was seen today for new patient (initial visit).  Diagnoses and all orders for this visit:  Abdominal pain, unspecified abdominal location -     CBC with Differential/Platelet -     CMP14+EGFR -     Lipase -     Pancreatic elastase, fecal -      Giardia/Cryptosporidium EIA -     Giardia/Cryptosporidium EIA -     Cdiff NAA+O+P+Stool Culture -     Celiac panel 10 -     HLA typing for celiac disease -     Reticulin Antibodies, IGA w Reflex to Titer -     Ambulatory referral to Gastroenterology -     hyoscyamine (ANASPAZ) 0.125 MG TBDP disintergrating tablet; Place 1 tablet (0.125 mg total) under the tongue every 4 (four) hours as needed for cramping (or bloating).  Generalized abdominal pain -     CBC with Differential/Platelet -     CMP14+EGFR -     Lipase -     Pancreatic elastase, fecal -     Giardia/Cryptosporidium EIA -     Giardia/Cryptosporidium EIA -     Cdiff NAA+O+P+Stool Culture -     Celiac panel 10 -     HLA typing for celiac disease -     Reticulin Antibodies, IGA w Reflex to Titer -     Ambulatory referral to Gastroenterology  Tenesmus -     CBC with Differential/Platelet -     CMP14+EGFR -     Lipase -     Pancreatic elastase, fecal -     Giardia/Cryptosporidium EIA -     Giardia/Cryptosporidium EIA -  Cdiff NAA+O+P+Stool Culture -     Celiac panel 10 -     HLA typing for celiac disease -     Reticulin Antibodies, IGA w Reflex to Titer -     Ambulatory referral to Gastroenterology  Diarrhea, unspecified type -     CBC with Differential/Platelet -     CMP14+EGFR -     Lipase -     Pancreatic elastase, fecal -     Giardia/Cryptosporidium EIA -     Giardia/Cryptosporidium EIA -     Cdiff NAA+O+P+Stool Culture -     Celiac panel 10 -     HLA typing for celiac disease -     Reticulin Antibodies, IGA w Reflex to Titer -     Ambulatory referral to Gastroenterology  Allergic rhinitis, unspecified seasonality, unspecified trigger  Nonintractable episodic headache, unspecified headache type   I have discontinued Ms. Forget's amitriptyline, HYDROcodone-acetaminophen, methylPREDNISolone, and orphenadrine. I am also having her start on hyoscyamine. Additionally, I am having her maintain her  multivitamin with minerals, ondansetron, fluticasone, and loratadine.  Meds ordered this encounter  Medications  . loratadine (CLARITIN) 10 MG tablet    Sig: Take 10 mg by mouth daily.  . hyoscyamine (ANASPAZ) 0.125 MG TBDP disintergrating tablet    Sig: Place 1 tablet (0.125 mg total) under the tongue every 4 (four) hours as needed for cramping (or bloating).    Dispense:  50 tablet    Refill:  2    Patient has a chronic illness with  severe exacerbation and progression. She has acute diarrhea on top of chronic abdominal pain and constipation. She will have lab work, a gluten-free diet, colonoscopy and x-ray. Further workup including diagnostic lower endoscopy, colonoscopy, CT scan, stool testing for fecal fat along with trial of pancreatic enzymes may well be tried as well. Records to be requested from her previous physician at [facility] urgent care [place]. Follow-up: Return in about 6 weeks (around 02/11/2017) for abd pain.Claretta Fraise, M.D.

## 2016-12-31 NOTE — Patient Instructions (Signed)
Gluten-Free Diet for Celiac Disease, Adult The gluten-free diet includes all foods that do not contain gluten. Gluten is a protein that is found in wheat, rye, barley, and some other grains. Following the gluten-free diet is the only treatment for people with celiac disease. It helps to prevent damage to the intestines and improves or eliminates the symptoms of celiac disease. Following the gluten-free diet requires some planning. It can be challenging at first, but it gets easier with time and practice. There are more gluten-free options available today than ever before. If you need help finding gluten-free foods or if you have questions, talk with your diet and nutrition specialist (registered dietitian) or your health care provider. What do I need to know about a gluten-free diet?  All fruits, vegetables, and meats are safe to eat and do not contain gluten.  When grocery shopping, start by shopping in the produce, meat, and dairy sections. These sections are more likely to contain gluten-free foods. Then move to the aisles that contain packaged foods if you need to.  Read all food labels. Gluten is often added to foods. Always check the ingredient list and look for warnings, such as "may contain gluten."  Talk with your dietitian or health care provider before taking a gluten-free multivitamin or mineral supplement.  Be aware of gluten-free foods having contact with foods that contain gluten (cross-contamination). This can happen at home and with any processed foods.  Talk with your health care provider or dietitian about how to reduce the risk of cross-contamination in your home.  If you have questions about how a food is processed, ask the manufacturer. What key words help to identify gluten? Foods that list any of these key words on the label usually contain gluten:  Wheat, flour, enriched flour, bromated flour, white flour, durum flour, graham flour, phosphated flour, self-rising flour,  semolina, farina, barley (malt), rye, and oats.  Starch, dextrin, modified food starch, or cereal.  Thickening, fillers, or emulsifiers.  Malt flavoring, malt extract, or malt syrup.  Hydrolyzed vegetable protein. In the [place] packaged foods that are gluten-free are required to be labeled "GF." These foods should be easy to identify and are safe to eat. In the [place] food companies are also required to list common food allergens, including wheat, on their labels. Recommended foods Grains  Amaranth, bean flours, 100% buckwheat flour, corn, millet, nut flours or nut meals, GF oats, quinoa, rice, sorghum, teff, rice wafers, pure cornmeal tortillas, popcorn, and hot cereals made from cornmeal. Hominy, rice, wild rice. Some Asian rice noodles or bean noodles. Arrowroot starch, corn bran, corn flour, corn germ, cornmeal, corn starch, potato flour, potato starch flour, and rice bran. Plain, brown, and sweet rice flours. Rice polish, soy flour, and tapioca starch. Vegetables  All plain fresh, frozen, and canned vegetables. Fruits  All plain fresh, frozen, canned, and dried fruits, and 100% fruit juices. Meats and other protein foods  All fresh beef, pork, poultry, fish, seafood, and eggs. Fish canned in water, oil, brine, or vegetable broth. Plain nuts and seeds, peanut butter. Some lunch meat and some frankfurters. Dried beans, dried peas, and lentils. Dairy  Fresh plain, dry, evaporated, or condensed milk. Cream, butter, sour cream, whipping cream, and most yogurts. Unprocessed cheese, most processed cheeses, some cottage cheese, some cream cheeses. Beverages  Coffee, tea, most herbal teas. Carbonated beverages and some root beers. Wine, sake, and distilled spirits, such as gin, vodka, and whiskey. Most hard ciders. Fats and oils  Butter, margarine,   vegetable oil, hydrogenated butter, olive oil, shortening, lard, cream, and some mayonnaise. Some commercial salad dressings. Olives. Sweets and  desserts  Sugar, honey, some syrups, molasses, jelly, and jam. Plain hard candy, marshmallows, and gumdrops. Pure cocoa powder. Plain chocolate. Custard and some pudding mixes. Gelatin desserts, sorbets, frozen ice pops, and sherbet. Cake, cookies, and other desserts prepared with allowed flours. Some commercial ice creams. Cornstarch, tapioca, and rice puddings. Seasoning and other foods  Some canned or frozen soups. Monosodium glutamate (MSG). Cider, rice, and wine vinegar. Baking soda and baking powder. Cream of tartar. Baking and nutritional yeast. Certain soy sauces made without wheat (ask your dietitian about specific brands that are allowed). Nuts, coconut, and chocolate. Salt, pepper, herbs, spices, flavoring extracts, imitation or artificial flavorings, natural flavorings, and food colorings. Some medicines and supplements. Some lip glosses and other cosmetics. Rice syrups. The items listed may not be a complete list. Talk with your dietitian about what dietary choices are best for you. Foods to avoid Grains  Barley, bran, bulgur, couscous, cracked wheat, [place], farro, graham, malt, matzo, semolina, wheat germ, and all wheat and rye cereals including spelt and kamut. Cereals containing malt as a flavoring, such as rice cereal. Noodles, spaghetti, macaroni, most packaged rice mixes, and all mixes containing wheat, rye, barley, or triticale. Vegetables  Most creamed vegetables and most vegetables canned in sauces. Some commercially prepared vegetables and salads. Fruits  Thickened or prepared fruits and some pie fillings. Some fruit snacks and fruit roll-ups. Meats and other protein foods  Any meat or meat alternative containing wheat, rye, barley, or gluten stabilizers. These are often marinated or packaged meats and lunch meats. Bread-containing products, such as Swiss steak, croquettes, meatballs, and meatloaf. Most tuna canned in vegetable broth and turkey with hydrolyzed vegetable  protein (HVP) injected as part of the basting. Seitan. Imitation fish. Eggs in sauces made from ingredients to avoid. Dairy  Commercial chocolate milk drinks and malted milk. Some non-dairy creamers. Any cheese product containing ingredients to avoid. Beverages  Certain cereal beverages. Beer, ale, malted milk, and some root beers. Some hard ciders. Some instant flavored coffees. Some herbal teas made with barley or with barley malt added. Fats and oils  Some commercial salad dressings. Sour cream containing modified food starch. Sweets and desserts  Some toffees. Chocolate-coated nuts (may be rolled in wheat flour) and some commercial candies and candy bars. Most cakes, cookies, donuts, pastries, and other baked goods. Some commercial ice cream. Ice cream cones. Commercially prepared mixes for cakes, cookies, and other desserts. Bread pudding and other puddings thickened with flour. Products containing brown rice syrup made with barley malt enzyme. Desserts and sweets made with malt flavoring. Seasoning and other foods  Some curry powders, some dry seasoning mixes, some gravy extracts, some meat sauces, some ketchups, some prepared mustards, and horseradish. Certain soy sauces. Malt vinegar. Bouillon and bouillon cubes that contain HVP. Some chip dips, and some chewing gum. Yeast extract. Brewer's yeast. Caramel color. Some medicines and supplements. Some lip glosses and other cosmetics. The items listed may not be a complete list. Talk with your dietitian about what dietary choices are best for you. Summary  Gluten is a protein that is found in wheat, rye, barley, and some other grains. The gluten-free diet includes all foods that do not contain gluten.  If you need help finding gluten-free foods or if you have questions, talk with your diet and nutrition specialist (registered dietitian) or your health care provider.  Read all   food labels. Gluten is often added to foods. Always check the  ingredient list and look for warnings, such as "may contain gluten." This information is not intended to replace advice given to you by your health care provider. Make sure you discuss any questions you have with your health care provider. Document Released: 08/27/2005 Document Revised: 06/11/2016 Document Reviewed: 06/11/2016 Elsevier Interactive Patient Education  2017 Elsevier Inc.  

## 2017-01-02 ENCOUNTER — Encounter: Payer: Self-pay | Admitting: Internal Medicine

## 2017-01-03 ENCOUNTER — Encounter: Payer: Self-pay | Admitting: Gastroenterology

## 2017-01-04 LAB — CMP14+EGFR
A/G RATIO: 1.9 (ref 1.2–2.2)
ALBUMIN: 4.5 g/dL (ref 3.5–5.5)
ALK PHOS: 83 IU/L (ref 39–117)
ALT: 11 IU/L (ref 0–32)
AST: 15 IU/L (ref 0–40)
BILIRUBIN TOTAL: 0.5 mg/dL (ref 0.0–1.2)
BUN / CREAT RATIO: 15 (ref 9–23)
BUN: 9 mg/dL (ref 6–24)
CHLORIDE: 99 mmol/L (ref 96–106)
CO2: 25 mmol/L (ref 18–29)
Calcium: 9.3 mg/dL (ref 8.7–10.2)
Creatinine, Ser: 0.62 mg/dL (ref 0.57–1.00)
GFR calc non Af Amer: 106 mL/min/{1.73_m2} (ref >59)
GFR, EST AFRICAN AMERICAN: 122 mL/min/{1.73_m2} (ref >59)
GLOBULIN, TOTAL: 2.4 g/dL (ref 1.5–4.5)
Glucose: 83 mg/dL (ref 65–99)
Potassium: 4 mmol/L (ref 3.5–5.2)
SODIUM: 140 mmol/L (ref 134–144)
Total Protein: 6.9 g/dL (ref 6.0–8.5)

## 2017-01-04 LAB — CBC WITH DIFFERENTIAL/PLATELET
BASOS: 0 %
Basophils Absolute: 0 10*3/uL (ref 0.0–0.2)
EOS (ABSOLUTE): 0.1 10*3/uL (ref 0.0–0.4)
Eos: 2 %
HEMOGLOBIN: 14.3 g/dL (ref 11.1–15.9)
Hematocrit: 42.7 % (ref 34.0–46.6)
IMMATURE GRANS (ABS): 0 10*3/uL (ref 0.0–0.1)
Immature Granulocytes: 0 %
LYMPHS: 36 %
Lymphocytes Absolute: 2.6 10*3/uL (ref 0.7–3.1)
MCH: 30.6 pg (ref 26.6–33.0)
MCHC: 33.5 g/dL (ref 31.5–35.7)
MCV: 91 fL (ref 79–97)
Monocytes Absolute: 0.5 10*3/uL (ref 0.1–0.9)
Monocytes: 7 %
NEUTROS ABS: 4 10*3/uL (ref 1.4–7.0)
Neutrophils: 55 %
PLATELETS: 232 10*3/uL (ref 150–379)
RBC: 4.67 x10E6/uL (ref 3.77–5.28)
RDW: 14.5 % (ref 12.3–15.4)
WBC: 7.3 10*3/uL (ref 3.4–10.8)

## 2017-01-04 LAB — CELIAC PANEL 10
Antigliadin Abs, IgA: 5 units (ref 0–19)
Endomysial IgA: NEGATIVE
Gliadin IgG: 7 units (ref 0–19)
IGA/IMMUNOGLOBULIN A, SERUM: 247 mg/dL (ref 87–352)
Transglutaminase IgA: <2 U/mL (ref 0–3)

## 2017-01-04 LAB — CELIAC DISEASE HLA DQ ASSOC.
DQ2 (DQA1 0501/0505, DQB1 02XX): POSITIVE
DQ8 (DQA1 03XX, DQB1 0302): NEGATIVE

## 2017-01-04 LAB — RETICULIN ANTIBODIES, IGA W TITER: RETICULIN AB, IGA: NEGATIVE {titer}

## 2017-01-04 LAB — LIPASE: LIPASE: 28 U/L (ref 14–72)

## 2017-01-10 ENCOUNTER — Encounter: Payer: Self-pay | Admitting: Gastroenterology

## 2017-01-10 ENCOUNTER — Ambulatory Visit (INDEPENDENT_AMBULATORY_CARE_PROVIDER_SITE_OTHER): Payer: 59 | Admitting: Gastroenterology

## 2017-01-10 ENCOUNTER — Other Ambulatory Visit (INDEPENDENT_AMBULATORY_CARE_PROVIDER_SITE_OTHER): Payer: 59

## 2017-01-10 VITALS — BP 108/68 | HR 66 | Ht 61.0 in | Wt 197.1 lb

## 2017-01-10 DIAGNOSIS — R1084 Generalized abdominal pain: Secondary | ICD-10-CM

## 2017-01-10 DIAGNOSIS — R1013 Epigastric pain: Secondary | ICD-10-CM

## 2017-01-10 DIAGNOSIS — R194 Change in bowel habit: Secondary | ICD-10-CM

## 2017-01-10 LAB — TSH: TSH: 6.67 u[IU]/mL — ABNORMAL HIGH (ref 0.35–4.50)

## 2017-01-10 LAB — C-REACTIVE PROTEIN: CRP: 0.8 mg/dL (ref 0.5–20.0)

## 2017-01-10 LAB — SEDIMENTATION RATE: Sed Rate: 6 mm/hr (ref 0–20)

## 2017-01-10 MED ORDER — DICYCLOMINE HCL 20 MG PO TABS: 20.0000 mg | ORAL_TABLET | Freq: Two times a day (BID) | ORAL | 2 refills | Status: DC

## 2017-01-10 MED ORDER — ONDANSETRON 4 MG PO TBDP: 4.0000 mg | ORAL_TABLET | Freq: Three times a day (TID) | ORAL | 1 refills | Status: DC | PRN

## 2017-01-10 NOTE — Progress Notes (Addendum)
01/10/2017 Kelli Allison [id number] [birth year]   HISTORY OF PRESENT ILLNESS:  This is a 51 year old female who is new to our office. She was referred here by her PCP, Dr. Livia Snellen, for evaluation of multiple GI complaints. First, she complains of diffuse abdominal pain and severe lower abdominal cramping when having a bowel movement. She says this cramping occurs to the point that she dreads having a bowel movement. She also then focuses on some epigastric abdominal pain that she said has been on and off for 2 years, but particularly worse over the past 6 months. Has intermittent nausea as well for which she takes Zofran as needed and that does help. She would like a refill on that today. She complains of feeling full all the time. She says that her bowel movements have never been regular. Every couple days or so she will have diarrhea, but that other times seems constipated. She denies seeing blood in her stools. She has had some dark stools at times, but not necessarily black. She believes she had a colonoscopy about 15 years and they may have found some benign polyps, but she is not totally sure.  Complains of poor appetite.  She recently had a noncontrast CT scan performed that was unremarkable. Recent CBC, CMP, lipase were within normal limits. 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  She has been trying to avoid gluten as much as possible, but has not been completely gluten-free. She also had stool studies ordered for Giardia/cryptosporidium and also a pancreatic elastase, but those have not been performed as she had not had any recent diarrhea.   Past Medical History:  Diagnosis Date  . Hearing loss   . Migraine   . Obesity    Past Surgical History:  Procedure Laterality Date  . CESAREAN SECTION    . CHOLECYSTECTOMY    . INNER EAR SURGERY    . TUBAL LIGATION      reports that she has never  smoked. She has never used smokeless tobacco. She reports that she drinks alcohol. She reports that she does not use drugs. family history includes Alcohol abuse in her mother; Asthma in her maternal grandfather and mother; Birth defects in her brother and son; COPD in her maternal grandfather and mother; Cancer in her paternal grandmother; Depression in her brother, brother, daughter, and mother; Diabetes in her maternal grandmother; Drug abuse in her mother; Heart disease in her maternal grandmother; Hyperlipidemia in her maternal grandmother; Hypertension in her maternal grandmother; Kidney disease in her son; Learning disabilities in her brother and brother; Miscarriages / [place] in her daughter, maternal grandmother, and mother. Allergies  Allergen Reactions  . Aspirin Nausea Only  . Caffeine Other (See Comments)    Or the color in sodas-causes GI problems  . Penicillins Hives    Has patient had a PCN reaction causing immediate rash, facial/tongue/throat swelling, SOB or lightheadedness with hypotension: YES Has patient had a PCN reaction causing severe rash involving mucus membranes or skin necrosis: NO Has patient had a PCN reaction that required hospitalization NO Has patient had a PCN reaction occurring within the last 10 years:NO If all of the above answers are "NO", then may proceed with Cephalosporin use.      Outpatient Encounter Prescriptions as of 01/10/2017  Medication Sig  . fluticasone (FLONASE) 50 MCG/ACT nasal spray Place 1 spray into both nostrils  daily.  . loratadine (CLARITIN) 10 MG tablet Take 10 mg by mouth daily.  . Multiple Vitamin (MULITIVITAMIN WITH MINERALS) TABS Take 1 tablet by mouth daily.  . ondansetron (ZOFRAN ODT) 4 MG disintegrating tablet Take 1 tablet (4 mg total) by mouth every 8 (eight) hours as needed for nausea or vomiting. (Patient not taking: Reported on 11/01/2016)  . [DISCONTINUED] hyoscyamine (ANASPAZ) 0.125 MG TBDP disintergrating tablet Place  1 tablet (0.125 mg total) under the tongue every 4 (four) hours as needed for cramping (or bloating).   No facility-administered encounter medications on file as of 01/10/2017.      REVIEW OF SYSTEMS  : All other systems reviewed and negative except where noted in the History of Present Illness.   PHYSICAL EXAM: BP 108/68   Pulse 66   Ht '5\' 1"'$  (1.549 m)   Wt 197 lb 2 oz (89.4 kg)   BMI 37.25 kg/m  General: Well developed white female in no acute distress Head: Normocephalic and atraumatic Eyes:  Sclerae anicteric, conjunctiva pink. Ears: Normal auditory acuity Lungs: Clear throughout to auscultation Heart: Regular rate and rhythm Abdomen: Soft, non-distended. Normal bowel sounds.  Mild diffuse TTP. Rectal:  Will be done at the time of colonoscopy. Musculoskeletal: Symmetrical with no gross deformities  Skin: No lesions on visible extremities Extremities: No edema  Neurological: Alert oriented x 4, grossly non-focal Psychological:  Alert and cooperative. Normal mood and affect  ASSESSMENT AND PLAN: -51 year old female with multiple GI complaints including generalized abdominal pain, nausea, alternating bowel habits between constipation and diarrhea, sensation of fullness, poor appetite.  Recent noncontrast CT scan unremarkable. Labs unremarkable. May just be IBS. I'm going to schedule her for both an EGD and colonoscopy. I'm going to request that Dr. Carlean Purl perform duodenal biopsies to rule out celiac disease. 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  I'm also going to check TSH, sedimentation rate, CRP. We will refill her Zofran as this works for her nausea. I'm also going to give her some Bentyl 20 mg to take twice daily.  *The risks, benefits, and alternatives to EGD and colonoscopy were discussed with the patient and she consents to proceed.   CC:  Leonard Downing, *  Agree with Ms. Alphia Kava  management.  Gatha Mayer, MD, [facility]

## 2017-01-10 NOTE — Patient Instructions (Signed)
Your physician has requested that you go to the basement for lab work before leaving today.  We have sent the following medications to your pharmacy for you to pick up at your convenience:  Zofran   Bentyl 20 mg twice a day  You have been scheduled for an endoscopy and colonoscopy. Please follow the written instructions given to you at your visit today. Please pick up your prep supplies at the pharmacy within the next 1-3 days. If you use inhalers (even only as needed), please bring them with you on the day of your procedure. Your physician has requested that you go to www.startemmi.com and enter the access code given to you at your visit today. This web site gives a general overview about your procedure. However, you should still follow specific instructions given to you by our office regarding your preparation for the procedure.

## 2017-01-16 DIAGNOSIS — R1013 Epigastric pain: Secondary | ICD-10-CM | POA: Insufficient documentation

## 2017-01-16 DIAGNOSIS — R194 Change in bowel habit: Secondary | ICD-10-CM | POA: Insufficient documentation

## 2017-01-17 ENCOUNTER — Telehealth: Payer: Self-pay | Admitting: Family Medicine

## 2017-01-17 DIAGNOSIS — R7989 Other specified abnormal findings of blood chemistry: Secondary | ICD-10-CM

## 2017-01-17 NOTE — Telephone Encounter (Signed)
It was slightly elevated. First we should repeat the level. Can you put in future order for TSH, pt can come by when she is able.

## 2017-01-17 NOTE — Telephone Encounter (Signed)
Future order placed. Patient aware and verbalizes understanding.

## 2017-01-17 NOTE — Telephone Encounter (Signed)
Covering for PCP, please review TSH in chart.

## 2017-01-30 ENCOUNTER — Telehealth: Payer: Self-pay | Admitting: Gastroenterology

## 2017-01-31 DIAGNOSIS — R112 Nausea with vomiting, unspecified: Secondary | ICD-10-CM | POA: Diagnosis not present

## 2017-01-31 DIAGNOSIS — R1031 Right lower quadrant pain: Secondary | ICD-10-CM | POA: Diagnosis not present

## 2017-01-31 NOTE — Telephone Encounter (Signed)
The pt states she is having severe abd pain 12/10 and diarrhea, nausea and vomiting. She is taking zofran and bentyl with no relief.   She is scheduled for endo colon with Dr Leone PayorGessner 03/01/17.  Pt was advised if her pain is that severe in her abdomen and she is not eating or drinking she needs to be evaluated at the ED.  Pt agreed and states she has a caregiver with her to take her to [facility] ED now.

## 2017-01-31 NOTE — Telephone Encounter (Signed)
Left message on machine to call back  

## 2017-02-01 ENCOUNTER — Ambulatory Visit (INDEPENDENT_AMBULATORY_CARE_PROVIDER_SITE_OTHER): Payer: 59

## 2017-02-01 ENCOUNTER — Telehealth: Payer: Self-pay | Admitting: Internal Medicine

## 2017-02-01 ENCOUNTER — Encounter: Payer: Self-pay | Admitting: Family Medicine

## 2017-02-01 ENCOUNTER — Ambulatory Visit (INDEPENDENT_AMBULATORY_CARE_PROVIDER_SITE_OTHER): Payer: 59 | Admitting: Family Medicine

## 2017-02-01 ENCOUNTER — Telehealth: Payer: Self-pay

## 2017-02-01 VITALS — BP 116/68 | HR 67 | Temp 97.0°F | Ht 61.0 in | Wt 197.0 lb

## 2017-02-01 DIAGNOSIS — R1031 Right lower quadrant pain: Secondary | ICD-10-CM

## 2017-02-01 NOTE — Telephone Encounter (Signed)
[facility] from [facility] , Carlan, called to report patient is in their office today with terrible abdominal pain.  [facility] and was sent home after normal CT. Sr. Stacks feels patient could have an appendicitis that is not seen on the CT, he had [facility] review and they did not feel they had anything to offer her at this time.  They would like patient evaluated by GI today.  Shanda BumpsJessica they are holding her in their office.  Will you please review notes from ED and Dr. Darlyn ReadStacks today. Are there any other imaging studies you would recommend or options for the patient.  Dr. Darlyn ReadStacks is concerned about the amount of pain she is in.

## 2017-02-01 NOTE — Telephone Encounter (Signed)
Doug SouJessica Zehr, PA reviewed charts and notes. She does not recommend any additional imaging studies.  Patient can be offered to split the colonoscopy and upper endoscopy and we could have the colonoscopy on 6/5.  Carlan will speak with he patient and if she wants to do that she will call back.

## 2017-02-01 NOTE — Progress Notes (Signed)
Subjective:  Patient ID: Kelli Allison, female    DOB: [birth year]  Age: 51 y.o. MRN: [id number]  CC: Abdominal Pain (pt here today c/o RLQ pain level 10/10 and was seen at [facility] ED yesterday and had CT scan and bloodwork. All of it came back normal. She is nauseous and hasn't eaten anything.)   HPI Kelli Allison presents for 10/10 right lower quadrant pain that is sharp. This is been going on for 5 days. It is currently and has been frequently this week 10/10. Yesterday she presented to [facility] emergency department. She had a CT of the abdomen and pelvis which showed a 1.1 cm indeterminate left adrenal nodule even though her pain is on the right side. No bowel obstruction appendix was normal appearing pelvic showed that there was no sign of an ovarian cyst. Essentially the CT showed no results that might establish a diagnosis for this pain. Patient says she is having diarrhea and vomiting with this. She states that it is a different feeling pain than what she came in for a few weeks ago. She has been in contact with gastroenterology. They have a colonoscopy set up for her for June 22. Blood work performed on 524 at [facility] showed her white count and hemoglobin to be normal with normal differential. Her chemistry panel also was completely normal range. Urinalysis also was in normal range History Nate has a past medical history of Hearing loss; Migraine; and Obesity.   She has a past surgical history that includes Inner ear surgery; Cesarean section; Cholecystectomy; and Tubal ligation.   Her family history includes Alcohol abuse in her mother; Asthma in her maternal grandfather and mother; Birth defects in her brother and son; COPD in her maternal grandfather and mother; Cancer in her paternal grandmother; Depression in her brother, brother, daughter, and mother; Diabetes in her maternal grandmother; Drug abuse in her mother; Heart disease in her maternal grandmother;  Hyperlipidemia in her maternal grandmother; Hypertension in her maternal grandmother; Kidney disease in her son; Learning disabilities in her brother and brother; Miscarriages / [place] in her daughter, maternal grandmother, and mother.She reports that she has never smoked. She has never used smokeless tobacco. She reports that she drinks alcohol. She reports that she does not use drugs.    ROS Review of Systems  Constitutional: Negative for activity change, appetite change and fever.  HENT: Negative for congestion, rhinorrhea and sore throat.   Eyes: Negative for visual disturbance.  Respiratory: Negative for cough and shortness of breath.   Cardiovascular: Negative for chest pain and palpitations.  Gastrointestinal: Positive for abdominal pain, nausea and vomiting. Negative for constipation.  Genitourinary: Negative for dysuria.  Musculoskeletal: Negative for arthralgias and myalgias.    Objective:  BP 116/68   Pulse 67   Temp 97 F (36.1 C) (Oral)   Ht 5\' 1"  (1.549 m)   Wt 197 lb (89.4 kg)   BMI 37.22 kg/m   BP Readings from Last 3 Encounters:  02/01/17 116/68  01/10/17 108/68  12/31/16 115/63    Wt Readings from Last 3 Encounters:  02/01/17 197 lb (89.4 kg)  01/10/17 197 lb 2 oz (89.4 kg)  12/31/16 197 lb (89.4 kg)     Physical Exam  Constitutional: She is oriented to person, place, and time. She appears well-developed and well-nourished.  HENT:  Head: Normocephalic and atraumatic.  Cardiovascular: Normal rate and regular rhythm.   No murmur heard. Pulmonary/Chest: Effort normal and breath sounds normal.  Abdominal: Soft. Bowel sounds are normal. She exhibits no mass. There is tenderness. There is guarding (RLQ at [facility]). There is no rebound.  Neurological: She is alert and oriented to person, place, and time.  Skin: Skin is warm and dry.  Psychiatric: She has a normal mood and affect. Her behavior is normal.      Assessment & Plan:   Bradlie  was seen today for abdominal pain.  Diagnoses and all orders for this visit:  Abdominal pain, RLQ -     DG Abd 2 Views; Future -     Lipase -     CBC with Differential/Platelet -     Urinalysis, Complete -     Ambulatory referral to General Surgery       I am having Ms. Delford Field maintain her multivitamin with minerals, fluticasone, loratadine, ondansetron, dicyclomine, acetaminophen, and ibuprofen.  Allergies as of 02/01/2017      Reactions   Aspirin Nausea Only   Caffeine Other (See Comments)   Or the color in sodas-causes GI problems   Penicillins Hives   Has patient had a PCN reaction causing immediate rash, facial/tongue/throat swelling, SOB or lightheadedness with hypotension: YES Has patient had a PCN reaction causing severe rash involving mucus membranes or skin necrosis: NO Has patient had a PCN reaction that required hospitalization NO Has patient had a PCN reaction occurring within the last 10 years:NO If all of the above answers are "NO", then may proceed with Cephalosporin use.      Medication List       Accurate as of 02/01/17  9:53 AM. Always use your most recent med list.          acetaminophen 325 MG tablet Commonly known as:  TYLENOL Take 650 mg by mouth.   dicyclomine 20 MG tablet Commonly known as:  BENTYL Take 1 tablet (20 mg total) by mouth 2 (two) times daily.   fluticasone 50 MCG/ACT nasal spray Commonly known as:  FLONASE Place 1 spray into both nostrils daily.   ibuprofen 600 MG tablet Commonly known as:  ADVIL,MOTRIN Take 600 mg by mouth.   loratadine 10 MG tablet Commonly known as:  CLARITIN Take 10 mg by mouth daily.   multivitamin with minerals Tabs tablet Take 1 tablet by mouth daily.   ondansetron 4 MG disintegrating tablet Commonly known as:  ZOFRAN ODT Take 1 tablet (4 mg total) by mouth every 8 (eight) hours as needed for nausea or vomiting.        Follow-up: Return in about 3 days (around 02/04/2017).  Mechele Claude, M.D.

## 2017-02-01 NOTE — Telephone Encounter (Signed)
As we discussed

## 2017-02-01 NOTE — Telephone Encounter (Signed)
See earlier phone note.  Patient does want to split the procedure.  She will come for the colonoscopy on 02/12/17 at 1:30.  We updated her instructions over the phone.  She understands that only the colonoscopy will be performed on 02/12/17 and the EGD will be on 6/22.  She will call back if she has any other questions or concerns.

## 2017-02-02 LAB — CBC WITH DIFFERENTIAL/PLATELET
BASOS: 1 %
Basophils Absolute: 0 10*3/uL (ref 0.0–0.2)
EOS (ABSOLUTE): 0.1 10*3/uL (ref 0.0–0.4)
Eos: 2 %
HEMATOCRIT: 42 % (ref 34.0–46.6)
Hemoglobin: 14 g/dL (ref 11.1–15.9)
Immature Grans (Abs): 0 10*3/uL (ref 0.0–0.1)
Immature Granulocytes: 0 %
Lymphocytes Absolute: 1.7 10*3/uL (ref 0.7–3.1)
Lymphs: 29 %
MCH: 30.3 pg (ref 26.6–33.0)
MCHC: 33.3 g/dL (ref 31.5–35.7)
MCV: 91 fL (ref 79–97)
MONOS ABS: 0.4 10*3/uL (ref 0.1–0.9)
Monocytes: 7 %
NEUTROS ABS: 3.6 10*3/uL (ref 1.4–7.0)
Neutrophils: 61 %
Platelets: 230 10*3/uL (ref 150–379)
RBC: 4.62 x10E6/uL (ref 3.77–5.28)
RDW: 14.3 % (ref 12.3–15.4)
WBC: 5.8 10*3/uL (ref 3.4–10.8)

## 2017-02-02 LAB — LIPASE: Lipase: 21 U/L (ref 14–72)

## 2017-02-08 ENCOUNTER — Ambulatory Visit: Payer: 59 | Admitting: Internal Medicine

## 2017-02-11 ENCOUNTER — Encounter: Payer: Self-pay | Admitting: Family Medicine

## 2017-02-11 ENCOUNTER — Ambulatory Visit (INDEPENDENT_AMBULATORY_CARE_PROVIDER_SITE_OTHER): Payer: 59 | Admitting: Family Medicine

## 2017-02-11 VITALS — BP 115/72 | HR 61 | Temp 97.2°F | Ht 61.0 in | Wt 198.0 lb

## 2017-02-11 DIAGNOSIS — R1013 Epigastric pain: Secondary | ICD-10-CM

## 2017-02-11 DIAGNOSIS — R1031 Right lower quadrant pain: Secondary | ICD-10-CM

## 2017-02-11 DIAGNOSIS — R197 Diarrhea, unspecified: Secondary | ICD-10-CM | POA: Diagnosis not present

## 2017-02-11 NOTE — Progress Notes (Signed)
Subjective:  Patient ID: Kelli Allison, female    DOB: 1966-09-01  Age: 51 y.o. MRN: [id number]  CC: Follow-up (pt here today following up on her abdominal pain. She has an appt tomorrow to have her colonoscopy.)   HPI Kelli Allison presents for Follow-up on her abdominal discomfort. It seems to move from the right lower quadrant up to the epigastrium. She has a history of cola cystectomy. Pain is 5/10 it has a sharp character to it. She has associated loose bowel movements 3-4 times daily alternating with no bowel movement all for up to 4 days at a time. She reports abdominal cramping with her bowel movements. She is scheduled for colonoscopy with gastroenterology tomorrow. They are awaiting the results of that before giving definitive care. Of note is that patient was seen about 10 days ago for severe abdominal pain. That has stabilized at this time. Infected has improved somewhat.   History Kelli Allison has a past medical history of Hearing loss; Migraine; and Obesity.   She has a past surgical history that includes Inner ear surgery; Cesarean section; Cholecystectomy; and Tubal ligation.   Her family history includes Alcohol abuse in her mother; Asthma in her maternal grandfather and mother; Birth defects in her brother and son; COPD in her maternal grandfather and mother; Cancer in her paternal grandmother; Depression in her brother, brother, daughter, and mother; Diabetes in her maternal grandmother; Drug abuse in her mother; Heart disease in her maternal grandmother; Hyperlipidemia in her maternal grandmother; Hypertension in her maternal grandmother; Kidney disease in her son; Learning disabilities in her brother and brother; Miscarriages / [place] in her daughter, maternal grandmother, and mother.She reports that she has never smoked. She has never used smokeless tobacco. She reports that she drinks alcohol. She reports that she does not use drugs.    ROS Review of Systems    Constitutional: Positive for appetite change. Negative for activity change and fever.  HENT: Negative for congestion, rhinorrhea and sore throat.   Eyes: Negative for visual disturbance.  Respiratory: Negative for cough and shortness of breath.   Cardiovascular: Negative for chest pain and palpitations.  Gastrointestinal: Positive for abdominal distention, abdominal pain, constipation, diarrhea and nausea.  Genitourinary: Negative for dysuria.  Musculoskeletal: Negative for arthralgias and myalgias.    Objective:  BP 115/72   Pulse 61   Temp 97.2 F (36.2 C) (Oral)   Ht 5\' 1"  (1.549 m)   Wt 198 lb (89.8 kg)   BMI 37.41 kg/m   BP Readings from Last 3 Encounters:  02/11/17 115/72  02/01/17 116/68  01/10/17 108/68    Wt Readings from Last 3 Encounters:  02/11/17 198 lb (89.8 kg)  02/01/17 197 lb (89.4 kg)  01/10/17 197 lb 2 oz (89.4 kg)     Physical Exam  Constitutional: She is oriented to person, place, and time. She appears well-developed and well-nourished.  HENT:  Head: Normocephalic and atraumatic.  Cardiovascular: Normal rate and regular rhythm.   No murmur heard. Pulmonary/Chest: Effort normal and breath sounds normal.  Abdominal: Soft. Bowel sounds are normal. She exhibits no mass. There is tenderness. There is no rebound and no guarding.  Neurological: She is alert and oriented to person, place, and time.  Skin: Skin is warm and dry.  Psychiatric: She has a normal mood and affect. Her behavior is normal.      Assessment & Plan:   Kelli Allison was seen today for follow-up.  Diagnoses and all orders for this visit:  Abdominal pain, epigastric  Abdominal pain, RLQ  Diarrhea, unspecified type       I am having Kelli Allison maintain her multivitamin with minerals, fluticasone, loratadine, ondansetron, dicyclomine, acetaminophen, and ibuprofen.  Allergies as of 02/11/2017      Reactions   Aspirin Nausea Only   Caffeine Other (See Comments)   Or the color  in sodas-causes GI problems   Penicillins Hives   Has patient had a PCN reaction causing immediate rash, facial/tongue/throat swelling, SOB or lightheadedness with hypotension: YES Has patient had a PCN reaction causing severe rash involving mucus membranes or skin necrosis: NO Has patient had a PCN reaction that required hospitalization NO Has patient had a PCN reaction occurring within the last 10 years:NO If all of the above answers are "NO", then may proceed with Cephalosporin use.      Medication List       Accurate as of 02/11/17  6:27 PM. Always use your most recent med list.          acetaminophen 325 MG tablet Commonly known as:  TYLENOL Take 650 mg by mouth.   dicyclomine 20 MG tablet Commonly known as:  BENTYL Take 1 tablet (20 mg total) by mouth 2 (two) times daily.   fluticasone 50 MCG/ACT nasal spray Commonly known as:  FLONASE Place 1 spray into both nostrils daily.   ibuprofen 600 MG tablet Commonly known as:  ADVIL,MOTRIN Take 600 mg by mouth.   loratadine 10 MG tablet Commonly known as:  CLARITIN Take 10 mg by mouth daily.   multivitamin with minerals Tabs tablet Take 1 tablet by mouth daily.   ondansetron 4 MG disintegrating tablet Commonly known as:  ZOFRAN ODT Take 1 tablet (4 mg total) by mouth every 8 (eight) hours as needed for nausea or vomiting.      Changes in treatment will be based on the results of her colonoscopy. This will be done in conjunction with her GI consultation.  Follow-up: Return in about 1 month (around 03/13/2017).  Mechele Claude, M.D.

## 2017-02-12 ENCOUNTER — Encounter: Payer: Self-pay | Admitting: Internal Medicine

## 2017-02-12 ENCOUNTER — Ambulatory Visit (AMBULATORY_SURGERY_CENTER): Payer: 59 | Admitting: Internal Medicine

## 2017-02-12 ENCOUNTER — Other Ambulatory Visit: Payer: Self-pay | Admitting: Internal Medicine

## 2017-02-12 VITALS — BP 104/75 | HR 60 | Temp 98.9°F | Resp 16 | Ht 61.0 in | Wt 198.0 lb

## 2017-02-12 DIAGNOSIS — Z1211 Encounter for screening for malignant neoplasm of colon: Secondary | ICD-10-CM | POA: Diagnosis not present

## 2017-02-12 DIAGNOSIS — R194 Change in bowel habit: Secondary | ICD-10-CM | POA: Diagnosis not present

## 2017-02-12 DIAGNOSIS — E669 Obesity, unspecified: Secondary | ICD-10-CM | POA: Insufficient documentation

## 2017-02-12 DIAGNOSIS — R1031 Right lower quadrant pain: Secondary | ICD-10-CM | POA: Diagnosis present

## 2017-02-12 MED ORDER — SODIUM CHLORIDE 0.9 % IV SOLN: 500.0000 mL | INTRAVENOUS | Status: DC

## 2017-02-12 MED ORDER — GLYCOPYRROLATE 2 MG PO TABS: 2.0000 mg | ORAL_TABLET | Freq: Two times a day (BID) | ORAL | 0 refills | Status: DC

## 2017-02-12 NOTE — Progress Notes (Signed)
R/S'ed EGD from 03/01/17 to 02/14/17 and new instructions given to patient and her care partner as she was in recovery in the [facility] from her colonoscopy done today.

## 2017-02-12 NOTE — Op Note (Signed)
[facility] Patient Name: Kelli Allison Procedure Date: 02/12/2017 1:27 PM MRN: [id number] Endoscopist: Iva Boop , MD Age: 51 Referring MD:  Date of Birth: 08/06/1966 Gender: Female Account #: [id number] Procedure:                Colonoscopy Indications:              Abdominal pain in the right lower quadrant, Change                            in bowel habits Medicines:                Propofol per Anesthesia, Monitored Anesthesia Care Procedure:                Pre-Anesthesia Assessment:                           - Prior to the procedure, a History and Physical                            was performed, and patient medications and                            allergies were reviewed. The patient's tolerance of                            previous anesthesia was also reviewed. The risks                            and benefits of the procedure and the sedation                            options and risks were discussed with the patient.                            All questions were answered, and informed consent                            was obtained. Prior Anticoagulants: The patient has                            taken no previous anticoagulant or antiplatelet                            agents. ASA Grade Assessment: II - A patient with                            mild systemic disease. After reviewing the risks                            and benefits, the patient was deemed in                            satisfactory condition to undergo the procedure.  After obtaining informed consent, the colonoscope                            was passed under direct vision. Throughout the                            procedure, the patient's blood pressure, pulse, and                            oxygen saturations were monitored continuously. The                            Colonoscope was introduced through the anus and                            advanced to the the  terminal ileum, with                            identification of the appendiceal orifice and IC                            valve. The terminal ileum, ileocecal valve,                            appendiceal orifice, and rectum were photographed.                            The quality of the bowel preparation was excellent.                            The colonoscopy was performed without difficulty.                            The patient tolerated the procedure well. The bowel                            preparation used was Miralax. Scope In: 1:41:22 PM Scope Out: 1:55:37 PM Scope Withdrawal Time: 0 hours 12 minutes 0 seconds  Total Procedure Duration: 0 hours 14 minutes 15 seconds  Findings:                 The perianal and digital rectal examinations were                            normal.                           The colon (entire examined portion) appeared normal.                           No additional abnormalities were found on                            retroflexion.  The terminal ileum appeared normal. Complications:            No immediate complications. Estimated blood loss:                            None. Estimated Blood Loss:     Estimated blood loss: none. Recommendation:           - Repeat colonoscopy in 10 years for screening                            purposes.                           - Patient has a contact number available for                            emergencies. The signs and symptoms of potential                            delayed complications were discussed with the                            patient. Return to normal activities tomorrow.                            Written discharge instructions were provided to the                            patient.                           - Resume previous diet.                           - Continue present medications.                           - DC dicyclomine                           Try  glycopyrrolate 2 mg bid                           see if we can do EGD sooner if she wants Iva Boop, MD 02/12/2017 2:07:32 PM This report has been signed electronically.

## 2017-02-12 NOTE — Patient Instructions (Addendum)
This examination was normal - no cause of pain found. It may be coming from severe spasms and they do not show up on testing.  Please try glycopyrolate 2 mg twice a day regularly to see if that helps.  I will see if we can do the upper endoscopy sooner than scheduled if you want.   I appreciate the opportunity to care for you. Srah Ake E. Lexy Meininger, MD, FACG       Iva Boop    YOU HAD AN ENDOSCOPIC PROCEDURE TODAY AT THE [facility]:   Refer to the procedure report that was given to you for any specific questions about what was found during the examination.  If the procedure report does not answer your questions, please call your gastroenterologist to clarify.  If you requested that your care partner not be given the details of your procedure findings, then the procedure report has been included in a sealed envelope for you to review at your convenience later.  YOU SHOULD EXPECT: Some feelings of bloating in the abdomen. Passage of more gas than usual.  Walking can help get rid of the air that was put into your GI tract during the procedure and reduce the bloating. If you had a lower endoscopy (such as a colonoscopy or flexible sigmoidoscopy) you may notice spotting of blood in your stool or on the toilet paper. If you underwent a bowel prep for your procedure, you may not have a normal bowel movement for a few days.  Please Note:  You might notice some irritation and congestion in your nose or some drainage.  This is from the oxygen used during your procedure.  There is no need for concern and it should clear up in a day or so.  SYMPTOMS TO REPORT IMMEDIATELY:   Following lower endoscopy (colonoscopy or flexible sigmoidoscopy):  Excessive amounts of blood in the stool  Significant tenderness or worsening of abdominal pains  Swelling of the abdomen that is new, acute  Fever of 100F or higher  For urgent or emergent issues, a gastroenterologist can be reached at any hour by  calling [phone].   DIET:  We do recommend a small meal at first, but then you may proceed to your regular diet.  Drink plenty of fluids but you should avoid alcoholic beverages for 24 hours.  MEDICATIONS:  Continue present medications, but discontinue dicyclomine. Try glycopyrrolate 2 mg by mouth twice daily.  Patient scheduled for EGD 02/14/2017 at 1430. Dr. Marvell FullerGessner's nurse PJ has come to recovery to give patient information on how to prepare for test.  ACTIVITY:  You should plan to take it easy for the rest of today and you should NOT DRIVE or use heavy machinery until tomorrow (because of the sedation medicines used during the test).    FOLLOW UP: Our staff will call the number listed on your records the next business day following your procedure to check on you and address any questions or concerns that you may have regarding the information given to you following your procedure. If we do not reach you, we will leave a message.  However, if you are feeling well and you are not experiencing any problems, there is no need to return our call.  We will assume that you have returned to your regular daily activities without incident.  If any biopsies were taken you will be contacted by phone or by letter within the next 1-3 weeks.  Please call us at [phone] if you  have not heard about the biopsies in 3 weeks.   Thank you for allowing [place] to provide for your healthcare needs today.   SIGNATURES/CONFIDENTIALITY: You and/or your care partner have signed paperwork which will be entered into your electronic medical record.  These signatures attest to the fact that that the information above on your After Visit Summary has been reviewed and is understood.  Full responsibility of the confidentiality of this discharge information lies with you and/or your care-partner.

## 2017-02-12 NOTE — Progress Notes (Signed)
vss alert and orientedx3. Pt satisfied with mac. Report to sarah rn

## 2017-02-13 ENCOUNTER — Telehealth: Payer: Self-pay | Admitting: *Deleted

## 2017-02-13 NOTE — Telephone Encounter (Signed)
  Follow up Call-  Call back number 02/12/2017  Post procedure Call Back phone  # [phone]  Permission to leave phone message Yes  Some recent data might be hidden     Patient questions:  Do you have a fever, pain , or abdominal swelling? No. Pain Score  0 *  Have you tolerated food without any problems? Yes.    Have you been able to return to your normal activities? Yes.    Do you have any questions about your discharge instructions: Diet   No. Medications  No. Follow up visit  No.  Do you have questions or concerns about your Care? No.  Actions: * If pain score is 4 or above: No action needed, pain <4.

## 2017-02-14 ENCOUNTER — Encounter: Payer: Self-pay | Admitting: Internal Medicine

## 2017-02-14 ENCOUNTER — Ambulatory Visit (AMBULATORY_SURGERY_CENTER): Payer: 59 | Admitting: Internal Medicine

## 2017-02-14 VITALS — BP 120/60 | HR 78 | Temp 98.9°F | Resp 18 | Ht 61.0 in | Wt 198.0 lb

## 2017-02-14 DIAGNOSIS — R1013 Epigastric pain: Secondary | ICD-10-CM

## 2017-02-14 DIAGNOSIS — K2210 Ulcer of esophagus without bleeding: Secondary | ICD-10-CM

## 2017-02-14 DIAGNOSIS — K209 Esophagitis, unspecified without bleeding: Secondary | ICD-10-CM

## 2017-02-14 DIAGNOSIS — K2960 Other gastritis without bleeding: Secondary | ICD-10-CM | POA: Diagnosis not present

## 2017-02-14 DIAGNOSIS — K222 Esophageal obstruction: Secondary | ICD-10-CM | POA: Diagnosis not present

## 2017-02-14 DIAGNOSIS — R1310 Dysphagia, unspecified: Secondary | ICD-10-CM | POA: Diagnosis not present

## 2017-02-14 DIAGNOSIS — K219 Gastro-esophageal reflux disease without esophagitis: Secondary | ICD-10-CM | POA: Diagnosis not present

## 2017-02-14 MED ORDER — PANTOPRAZOLE SODIUM 40 MG PO TBEC: 40.0000 mg | DELAYED_RELEASE_TABLET | Freq: Every day | ORAL | 2 refills | Status: DC

## 2017-02-14 MED ORDER — SODIUM CHLORIDE 0.9 % IV SOLN
4.0000 mg | Freq: Once | INTRAVENOUS | Status: AC
  Administered 2017-02-14: 4 mg via INTRAVENOUS

## 2017-02-14 MED ORDER — SODIUM CHLORIDE 0.9 % IV SOLN: 500.0000 mL | INTRAVENOUS | Status: DC

## 2017-02-14 NOTE — Op Note (Signed)
[facility] Patient Name: Samani Szarka Procedure Date: 02/14/2017 2:01 PM MRN: [id number] Endoscopist: Iva Boop , MD Age: 51 Referring MD:  Date of Birth: 01/13/1966 Gender: Female Account #: [id number] Procedure:                Upper GI endoscopy Indications:              Epigastric abdominal pain, Nausea with vomiting Medicines:                Propofol per Anesthesia, Monitored Anesthesia Care Procedure:                Pre-Anesthesia Assessment:                           - Prior to the procedure, a History and Physical                            was performed, and patient medications and                            allergies were reviewed. The patient's tolerance of                            previous anesthesia was also reviewed. The risks                            and benefits of the procedure and the sedation                            options and risks were discussed with the patient.                            All questions were answered, and informed consent                            was obtained. Prior Anticoagulants: The patient has                            taken no previous anticoagulant or antiplatelet                            agents. ASA Grade Assessment: II - A patient with                            mild systemic disease. After reviewing the risks                            and benefits, the patient was deemed in                            satisfactory condition to undergo the procedure.                           After obtaining informed consent, the endoscope was  passed under direct vision. Throughout the                            procedure, the patient's blood pressure, pulse, and                            oxygen saturations were monitored continuously. The                            Endoscope was introduced through the mouth, and                            advanced to the second part of duodenum. The upper                  GI endoscopy was accomplished without difficulty.                            The patient tolerated the procedure well. Scope In: Scope Out: Findings:                 A moderate Schatzki ring (acquired) was found at                            the gastroesophageal junction. A TTS dilator was                            passed through the scope. Dilation with an 18-19-20                            mm balloon dilator was performed to 20 mm. The                            dilation site was examined and showed mild                            improvement in luminal narrowing. Estimated blood                            loss was minimal.                           LA Grade A (one or more mucosal breaks less than 5                            mm, not extending between tops of 2 mucosal folds)                            esophagitis with no bleeding was found in the                            distal esophagus. Biopsies were taken with a cold  forceps for histology. Estimated blood loss was                            minimal.                           Multiple dispersed, small non-bleeding erosions                            were found in the prepyloric region of the stomach.                            There were stigmata of recent bleeding. Biopsies                            were taken with a cold forceps for histology.                            Verification of patient identification for the                            specimen was done. Estimated blood loss was minimal.                           The exam was otherwise without abnormality.                           The cardia and gastric fundus were normal on                            retroflexion. Complications:            No immediate complications. Estimated Blood Loss:     Estimated blood loss was minimal. Impression:               - Moderate Schatzki ring. Dilated.                           - LA Grade A  reflux esophagitis. Biopsied.                           - Non-bleeding erosive gastropathy. Biopsied.                           - The examination was otherwise normal. Recommendation:           - Patient has a contact number available for                            emergencies. The signs and symptoms of potential                            delayed complications were discussed with the                            patient. Return to normal activities tomorrow.  Written discharge instructions were provided to the                            patient.                           - Clear liquids x 1 hour then soft foods rest of                            day. Start prior diet tomorrow.                           - Continue present medications.                           - Await pathology results.                           - Follow an antireflux regimen. This includes:                           - Do not lie down for at least 3 to 4 hours after                            meals.                           - Raise the head of the bed 4 to 6 inches.                           - Decrease excess weight.                           - Avoid citrus juices and other acidic foods,                            alcohol, chocolate, mints, coffee and other                            caffeinated beverages, carbonated beverages, fatty                            and fried foods.                           - Avoid tight-fitting clothing.                           - Avoid cigarettes and other tobacco products.                           - Use Protonix (pantoprazole) 40 mg PO daily. Iva Boop, MD 02/14/2017 2:27:47 PM This report has been signed electronically.

## 2017-02-14 NOTE — Progress Notes (Signed)
Called to room to assist during endoscopic procedure.  Patient ID and intended procedure confirmed with present staff. Received instructions for my participation in the procedure from the performing physician.  

## 2017-02-14 NOTE — Progress Notes (Signed)
Patient nauseated with lots of secretions post procedure. Dr. Leone PayorGessner notified and Zofran 4 mg Iv prescribed and given. SM

## 2017-02-14 NOTE — Patient Instructions (Addendum)
There was inflammation in the stomach and esophagus and I took biopsies to understand why. I dilated the esophagus as there was a narrow spot - food could have been stopping there on the way down.  I have prescribed pantoprazole 40 mg daily for you to take. This should help. Stay on the glycopyrrolate also (the medicine I prescribed Tuesday)  Will call with results and plans  I appreciate the opportunity to care for you. Iva Booparl E. Gessner, MD, FACG   YOU HAD AN ENDOSCOPIC PROCEDURE TODAY AT THE [facility]:   Refer to the procedure report that was given to you for any specific questions about what was found during the examination.  If the procedure report does not answer your questions, please call your gastroenterologist to clarify.  If you requested that your care partner not be given the details of your procedure findings, then the procedure report has been included in a sealed envelope for you to review at your convenience later.  YOU SHOULD EXPECT: Some feelings of bloating in the abdomen. Passage of more gas than usual.  Walking can help get rid of the air that was put into your GI tract during the procedure and reduce the bloating. If you had a lower endoscopy (such as a colonoscopy or flexible sigmoidoscopy) you may notice spotting of blood in your stool or on the toilet paper. If you underwent a bowel prep for your procedure, you may not have a normal bowel movement for a few days.  Please Note:  You might notice some irritation and congestion in your nose or some drainage.  This is from the oxygen used during your procedure.  There is no need for concern and it should clear up in a day or so.  SYMPTOMS TO REPORT IMMEDIATELY:    Following upper endoscopy (EGD)  Vomiting of blood or coffee ground material  New chest pain or pain under the shoulder blades  Painful or persistently difficult swallowing  New shortness of breath  Fever of 100F or higher  Black,  tarry-looking stools  For urgent or emergent issues, a gastroenterologist can be reached at any hour by calling [phone].  Please read all handouts given to you by your recovery nurse.   DIET:  Please follow the post dilation diet  then you may proceed to your regular diet in the am. Drink plenty of fluids but you should avoid alcoholic beverages for 24 hours.  ACTIVITY:  You should plan to take it easy for the rest of today and you should NOT DRIVE or use heavy machinery until tomorrow (because of the sedation medicines used during the test).    FOLLOW UP: Our staff will call the number listed on your records the next business day following your procedure to check on you and address any questions or concerns that you may have regarding the information given to you following your procedure. If we do not reach you, we will leave a message.  However, if you are feeling well and you are not experiencing any problems, there is no need to return our call.  We will assume that you have returned to your regular daily activities without incident.  If any biopsies were taken you will be contacted by phone or by letter within the next 1-3 weeks.  Please call us at [phone] if you have not heard about the biopsies in 3 weeks.    SIGNATURES/CONFIDENTIALITY: You and/or your care partner have signed paperwork which will  be entered into your electronic medical record.  These signatures attest to the fact that that the information above on your After Visit Summary has been reviewed and is understood.  Full responsibility of the confidentiality of this discharge information lies with you and/or your care-partner.  Thank you for letting us take care of your healthcare needs today.

## 2017-02-14 NOTE — Progress Notes (Signed)
Alert and oriented x3, pleased with MAC, report to RN Sarah 

## 2017-02-14 NOTE — Progress Notes (Signed)
Nausea relieved. Patient dc'd home. SM

## 2017-02-14 NOTE — Progress Notes (Signed)
Pt's states no medical or surgical changes since previsit or office visit. 

## 2017-02-15 ENCOUNTER — Telehealth: Payer: Self-pay | Admitting: *Deleted

## 2017-02-15 NOTE — Telephone Encounter (Signed)
No answer, left message to call if questions or concerns. 

## 2017-02-15 NOTE — Telephone Encounter (Signed)
  Follow up Call-  Call back number 02/14/2017 02/12/2017  Post procedure Call Back phone  # [phone]  Permission to leave phone message Yes Yes  Some recent data might be hidden     Patient questions:  Do you have a fever, pain , or abdominal swelling? No. Pain Score  0 *  Have you tolerated food without any problems? Yes.    Have you been able to return to your normal activities? No.  Do you have any questions about your discharge instructions: Diet   No. Medications  No. Follow up visit  No.  Do you have questions or concerns about your Care? No.  Actions: * If pain score is 4 or above: No action needed, pain <4.

## 2017-02-22 NOTE — Progress Notes (Signed)
Biopsies show inflammation no cancer Pantoprazole should help  Please get a sx update - schedule f/u next available also  [facility]

## 2017-03-01 ENCOUNTER — Encounter: Payer: 59 | Admitting: Internal Medicine

## 2017-03-11 ENCOUNTER — Encounter: Payer: Self-pay | Admitting: Family Medicine

## 2017-03-11 ENCOUNTER — Ambulatory Visit (INDEPENDENT_AMBULATORY_CARE_PROVIDER_SITE_OTHER): Payer: 59 | Admitting: Family Medicine

## 2017-03-11 VITALS — BP 101/68 | HR 75 | Temp 97.1°F | Ht 61.0 in | Wt 198.0 lb

## 2017-03-11 DIAGNOSIS — M5416 Radiculopathy, lumbar region: Secondary | ICD-10-CM

## 2017-03-11 MED ORDER — CYCLOBENZAPRINE HCL 10 MG PO TABS: 10.0000 mg | ORAL_TABLET | Freq: Three times a day (TID) | ORAL | 1 refills | Status: DC | PRN

## 2017-03-11 MED ORDER — PREDNISONE 10 MG PO TABS: ORAL_TABLET | ORAL | 0 refills | Status: DC
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
## 2017-03-20 ENCOUNTER — Ambulatory Visit: Payer: 59 | Admitting: Physical Therapy

## 2017-03-25 ENCOUNTER — Emergency Department (HOSPITAL_COMMUNITY): Payer: 59

## 2017-03-25 ENCOUNTER — Emergency Department (HOSPITAL_COMMUNITY)
Admission: EM | Admit: 2017-03-25 | Discharge: 2017-03-25 | Disposition: A | Discharge status: Home / Self Care | Payer: 59 | Attending: Emergency Medicine | Admitting: Emergency Medicine

## 2017-03-25 ENCOUNTER — Encounter (HOSPITAL_COMMUNITY): Payer: Self-pay | Admitting: Emergency Medicine

## 2017-03-25 DIAGNOSIS — H538 Other visual disturbances: Secondary | ICD-10-CM | POA: Diagnosis not present

## 2017-03-25 DIAGNOSIS — Z791 Long term (current) use of non-steroidal anti-inflammatories (NSAID): Secondary | ICD-10-CM | POA: Diagnosis not present

## 2017-03-25 DIAGNOSIS — Z9049 Acquired absence of other specified parts of digestive tract: Secondary | ICD-10-CM | POA: Insufficient documentation

## 2017-03-25 DIAGNOSIS — G43109 Migraine with aura, not intractable, without status migrainosus: Secondary | ICD-10-CM

## 2017-03-25 DIAGNOSIS — R51 Headache: Secondary | ICD-10-CM | POA: Diagnosis present

## 2017-03-25 DIAGNOSIS — Z79899 Other long term (current) drug therapy: Secondary | ICD-10-CM | POA: Diagnosis not present

## 2017-03-25 DIAGNOSIS — G43009 Migraine without aura, not intractable, without status migrainosus: Secondary | ICD-10-CM | POA: Diagnosis not present

## 2017-03-25 LAB — DIFFERENTIAL
BASOS ABS: 0 10*3/uL (ref 0.0–0.1)
Basophils Relative: 0 %
Eosinophils Absolute: 0.2 10*3/uL (ref 0.0–0.7)
Eosinophils Relative: 2 %
LYMPHS ABS: 3.1 10*3/uL (ref 0.7–4.0)
LYMPHS PCT: 30 %
Monocytes Absolute: 0.8 10*3/uL (ref 0.1–1.0)
Monocytes Relative: 8 %
NEUTROS ABS: 6.3 10*3/uL (ref 1.7–7.7)
NEUTROS PCT: 60 %

## 2017-03-25 LAB — COMPREHENSIVE METABOLIC PANEL
ALBUMIN: 3.8 g/dL (ref 3.5–5.0)
ALK PHOS: 70 U/L (ref 38–126)
ALT: 10 U/L — ABNORMAL LOW (ref 14–54)
AST: 18 U/L (ref 15–41)
Anion gap: 11 (ref 5–15)
BILIRUBIN TOTAL: 0.6 mg/dL (ref 0.3–1.2)
BUN: 6 mg/dL (ref 6–20)
CALCIUM: 8.7 mg/dL — AB (ref 8.9–10.3)
CO2: 25 mmol/L (ref 22–32)
Chloride: 101 mmol/L (ref 101–111)
Creatinine, Ser: 0.79 mg/dL (ref 0.44–1.00)
GFR calc Af Amer: >60 mL/min (ref >60)
GFR calc non Af Amer: >60 mL/min (ref >60)
GLUCOSE: 117 mg/dL — AB (ref 65–99)
Potassium: 3.5 mmol/L (ref 3.5–5.1)
Sodium: 137 mmol/L (ref 135–145)
TOTAL PROTEIN: 6.5 g/dL (ref 6.5–8.1)

## 2017-03-25 LAB — URINALYSIS, ROUTINE W REFLEX MICROSCOPIC
Bilirubin Urine: NEGATIVE
GLUCOSE, UA: NEGATIVE mg/dL
Hgb urine dipstick: NEGATIVE
KETONES UR: NEGATIVE mg/dL
NITRITE: NEGATIVE
PH: 6 (ref 5.0–8.0)
Protein, ur: NEGATIVE mg/dL
SPECIFIC GRAVITY, URINE: 1.023 (ref 1.005–1.030)

## 2017-03-25 LAB — I-STAT CHEM 8, ED
BUN: 9 mg/dL (ref 6–20)
CREATININE: 0.6 mg/dL (ref 0.44–1.00)
Calcium, Ion: 1.06 mmol/L — ABNORMAL LOW (ref 1.15–1.40)
Chloride: 99 mmol/L — ABNORMAL LOW (ref 101–111)
Glucose, Bld: 121 mg/dL — ABNORMAL HIGH (ref 65–99)
HEMATOCRIT: 43 % (ref 36.0–46.0)
Hemoglobin: 14.6 g/dL (ref 12.0–15.0)
POTASSIUM: 3.5 mmol/L (ref 3.5–5.1)
Sodium: 139 mmol/L (ref 135–145)
TCO2: 27 mmol/L (ref 0–100)

## 2017-03-25 LAB — CBC
HCT: 42.2 % (ref 36.0–46.0)
HEMOGLOBIN: 13.8 g/dL (ref 12.0–15.0)
MCH: 29.7 pg (ref 26.0–34.0)
MCHC: 32.7 g/dL (ref 30.0–36.0)
MCV: 90.9 fL (ref 78.0–100.0)
Platelets: 201 10*3/uL (ref 150–400)
RBC: 4.64 MIL/uL (ref 3.87–5.11)
RDW: 14 % (ref 11.5–15.5)
WBC: 10.3 10*3/uL (ref 4.0–10.5)

## 2017-03-25 LAB — PROTIME-INR
INR: 0.97
Prothrombin Time: 12.9 seconds (ref 11.4–15.2)

## 2017-03-25 LAB — RAPID URINE DRUG SCREEN, HOSP PERFORMED
AMPHETAMINES: NOT DETECTED
BARBITURATES: NOT DETECTED
Benzodiazepines: NOT DETECTED
COCAINE: NOT DETECTED
Opiates: NOT DETECTED
TETRAHYDROCANNABINOL: NOT DETECTED

## 2017-03-25 LAB — I-STAT TROPONIN, ED: Troponin i, poc: 0 ng/mL (ref 0.00–0.08)

## 2017-03-25 LAB — APTT: APTT: 35 s (ref 24–36)

## 2017-03-25 LAB — ETHANOL: Alcohol, Ethyl (B): <5 mg/dL (ref <5)

## 2017-03-25 MED ORDER — MORPHINE SULFATE (PF) 4 MG/ML IV SOLN
4.0000 mg | Freq: Once | INTRAVENOUS | Status: AC
  Administered 2017-03-25: 4 mg via INTRAVENOUS
  Filled 2017-03-25: qty 1

## 2017-03-25 MED ORDER — DIPHENHYDRAMINE HCL 50 MG/ML IJ SOLN
25.0000 mg | Freq: Once | INTRAMUSCULAR | Status: AC
  Administered 2017-03-25: 25 mg via INTRAVENOUS
  Filled 2017-03-25: qty 1

## 2017-03-25 MED ORDER — BUTALBITAL-APAP-CAFFEINE 50-325-40 MG PO TABS: 1.0000 | ORAL_TABLET | Freq: Four times a day (QID) | ORAL | 0 refills | Status: DC | PRN

## 2017-03-25 MED ORDER — ONDANSETRON HCL 4 MG/2ML IJ SOLN
4.0000 mg | Freq: Once | INTRAMUSCULAR | Status: AC
  Administered 2017-03-25: 4 mg via INTRAVENOUS
  Filled 2017-03-25: qty 2

## 2017-03-25 MED ORDER — PROCHLORPERAZINE EDISYLATE 5 MG/ML IJ SOLN
10.0000 mg | Freq: Once | INTRAMUSCULAR | Status: AC
  Administered 2017-03-25: 10 mg via INTRAVENOUS
  Filled 2017-03-25: qty 2

## 2017-03-25 MED ORDER — ACETAMINOPHEN 500 MG PO TABS
1000.0000 mg | ORAL_TABLET | Freq: Once | ORAL | Status: AC
  Administered 2017-03-25: 1000 mg via ORAL
  Filled 2017-03-25: qty 2

## 2017-03-25 MED ORDER — DEXAMETHASONE SODIUM PHOSPHATE 10 MG/ML IJ SOLN
10.0000 mg | Freq: Once | INTRAMUSCULAR | Status: AC
  Administered 2017-03-25: 10 mg via INTRAVENOUS
  Filled 2017-03-25: qty 1

## 2017-03-25 MED ORDER — IOPAMIDOL (ISOVUE-370) INJECTION 76%
INTRAVENOUS | Status: AC
  Administered 2017-03-25: 50 mL
  Filled 2017-03-25: qty 50

## 2017-03-25 MED ORDER — PROMETHAZINE HCL 25 MG PO TABS: 25.0000 mg | ORAL_TABLET | Freq: Four times a day (QID) | ORAL | 0 refills | Status: DC | PRN

## 2017-03-25 NOTE — ED Notes (Signed)
Neurologist at bedside. 

## 2017-03-25 NOTE — ED Notes (Signed)
Patient ambulated to BR and tolerated well. 

## 2017-03-25 NOTE — Consult Note (Signed)
Admission H&P    Referring physician: HORTON, C  Chief Complaint: Severe headache and left side visual changes with blurring.  HPI: Kelli Allison is an 51 y.o. female history of migraine headaches and hearing loss presenting with severe headache and blurred vision on the left side. Headache started at 4 PM yesterday. She had sudden severe neck pain at the onset. She was last seen well at 8:00 PM. She woke up at 1 AM and noticed blurred vision. No speech changes are reported. She had no facial droop. She also had no weakness or numbness involving extremities. She has a history of scotomas associated with migraine headaches. However with current visual changes she's only had blurring with no scotomas. CT scan of her head, as well as CT angiogram is pending.  LSN: 8:00 PM on 03/24/2017  Past Medical History:  Diagnosis Date  . Hearing loss   . Migraine   . Obesity     Past Surgical History:  Procedure Laterality Date  . CESAREAN SECTION    . CHOLECYSTECTOMY    . INNER EAR SURGERY    . TUBAL LIGATION      Family History  Problem Relation Age of Onset  . Alcohol abuse Mother   . Asthma Mother   . COPD Mother   . Depression Mother   . Drug abuse Mother   . Miscarriages / [place] Mother   . Depression Brother   . Learning disabilities Brother   . Depression Daughter   . Miscarriages / [place] Daughter   . Birth defects Son        spinabifida, scoliosis  . Kidney disease Son   . Diabetes Maternal Grandmother   . Heart disease Maternal Grandmother   . Hyperlipidemia Maternal Grandmother   . Hypertension Maternal Grandmother   . Miscarriages / Stillbirths Maternal Grandmother   . Asthma Maternal Grandfather   . COPD Maternal Grandfather   . Cancer Paternal Grandmother   . Birth defects Brother        born with one kidney  . Depression Brother   . Learning disabilities Brother        dislexia   Social History:  reports that she has never smoked. She has never used  smokeless tobacco. She reports that she drinks alcohol. She reports that she does not use drugs.  Allergies:  Allergies  Allergen Reactions  . Aspirin Nausea Only  . Caffeine Other (See Comments)    Or the color in sodas-causes GI problems  . Penicillins Hives    Has patient had a PCN reaction causing immediate rash, facial/tongue/throat swelling, SOB or lightheadedness with hypotension: YES Has patient had a PCN reaction causing severe rash involving mucus membranes or skin necrosis: NO Has patient had a PCN reaction that required hospitalization NO Has patient had a PCN reaction occurring within the last 10 years:NO If all of the above answers are "NO", then may proceed with Cephalosporin use.    Medications: Outpatient medications were reviewed by me.  ROS: History obtained from the patient  General ROS: negative for - chills, fatigue, fever, night sweats, weight gain or weight loss Psychological ROS: negative for - behavioral disorder, hallucinations, memory difficulties, mood swings or suicidal ideation Ophthalmic ROS: negative for - blurry vision, double vision, eye pain or loss of vision ENT ROS: negative for - epistaxis, nasal discharge, oral lesions, sore throat, tinnitus or vertigo Allergy and Immunology ROS: negative for - hives or itchy/watery eyes Hematological and Lymphatic ROS: negative for -  bleeding problems, bruising or swollen lymph nodes Endocrine ROS: negative for - galactorrhea, hair pattern changes, polydipsia/polyuria or temperature intolerance Respiratory ROS: negative for - cough, hemoptysis, shortness of breath or wheezing Cardiovascular ROS: negative for - chest pain, dyspnea on exertion, edema or irregular heartbeat Gastrointestinal ROS: negative for - abdominal pain, diarrhea, hematemesis, nausea/vomiting or stool incontinence Genito-Urinary ROS: negative for - dysuria, hematuria, incontinence or urinary frequency/urgency Musculoskeletal ROS: negative  for - joint swelling or muscular weakness Neurological ROS: as noted in HPI Dermatological ROS: negative for rash and skin lesion changes  Physical Examination: Blood pressure 114/72, pulse 70, temperature 98.8 F (37.1 C), temperature source Oral, resp. rate 19, height 5\' 1"  (1.549 m), weight 87.1 kg (192 lb), SpO2 99 %.  HEENT-  Normocephalic, no lesions, without obvious abnormality.  Normal external eye and conjunctiva.  Normal TM's bilaterally.  Normal auditory canals and external ears. Normal external nose, mucus membranes and septum.  Normal pharynx. Neck supple with no masses, nodes, nodules or enlargement. Cardiovascular - regular rate and rhythm, S1, S2 normal, no murmur, click, rub or gallop Lungs - chest clear, no wheezing, rales, normal symmetric air entry Abdomen - soft, non-tender; bowel sounds normal; no masses,  no organomegaly Extremities - no joint deformities, effusion, or inflammation and no edema  Neurologic Examination: Mental Status: Alert, oriented, complaining of severe headache.  Speech fluent without evidence of aphasia. Able to follow commands without difficulty. Cranial Nerves: II-Visual fields were normal bilaterally with finger counting. III/IV/VI-Pupils were equal and reacted normally to light. Extraocular movements were full and conjugate.    V/VII-no facial numbness and no facial weakness. VIII-normal. X-normal speech and symmetrical palatal movement. XI: trapezius strength/neck flexion strength normal bilaterally XII-midline tongue extension with normal strength. Motor: 5/5 bilaterally with normal tone and bulk. No drift of left upper extremity was noted. Sensory: Normal throughout. Deep Tendon Reflexes: 2+ and symmetric. Plantars: Flexor bilaterally Cerebellar: Normal finger-to-nose testing.  Results for orders placed or performed during the hospital encounter of 03/25/17 (from the past 48 hour(s))  CBC     Status: None   Collection Time: 03/25/17   4:23 AM  Result Value Ref Range   WBC 10.3 4.0 - 10.5 K/uL   RBC 4.64 3.87 - 5.11 MIL/uL   Hemoglobin 13.8 12.0 - 15.0 g/dL   HCT 16.142.2 09.636.0 - 04.546.0 %   MCV 90.9 78.0 - 100.0 fL   MCH 29.7 26.0 - 34.0 pg   MCHC 32.7 30.0 - 36.0 g/dL   RDW 40.914.0 81.111.5 - 91.415.5 %   Platelets 201 150 - 400 K/uL  Differential     Status: None   Collection Time: 03/25/17  4:23 AM  Result Value Ref Range   Neutrophils Relative % 60 %   Neutro Abs 6.3 1.7 - 7.7 K/uL   Lymphocytes Relative 30 %   Lymphs Abs 3.1 0.7 - 4.0 K/uL   Monocytes Relative 8 %   Monocytes Absolute 0.8 0.1 - 1.0 K/uL   Eosinophils Relative 2 %   Eosinophils Absolute 0.2 0.0 - 0.7 K/uL   Basophils Relative 0 %   Basophils Absolute 0.0 0.0 - 0.1 K/uL  I-stat troponin, ED (not at [facility])     Status: None   Collection Time: 03/25/17  4:33 AM  Result Value Ref Range   Troponin i, poc 0.00 0.00 - 0.08 ng/mL   Comment 3            Comment: Due to the release kinetics of  cTnI, a negative result within the first hours of the onset of symptoms does not rule out myocardial infarction with certainty. If myocardial infarction is still suspected, repeat the test at appropriate intervals.   I-Stat Chem 8, ED  (not at [facility])     Status: Abnormal   Collection Time: 03/25/17  4:34 AM  Result Value Ref Range   Sodium 139 135 - 145 mmol/L   Potassium 3.5 3.5 - 5.1 mmol/L   Chloride 99 (L) 101 - 111 mmol/L   BUN 9 6 - 20 mg/dL   Creatinine, Ser 8.11 0.44 - 1.00 mg/dL   Glucose, Bld 914 (H) 65 - 99 mg/dL   Calcium, Ion 7.82 (L) 1.15 - 1.40 mmol/L   TCO2 27 0 - 100 mmol/L   Hemoglobin 14.6 12.0 - 15.0 g/dL   HCT 95.6 21.3 - 08.6 %   No results found.  Assessment: 51 y.o. female history of migraine headaches is an 97 with probable complex migraine headache with associated left visual blurring.  Plan: 1. Agree with obtaining CT angiogram of head and neck to rule out possible vertebral or carotid dissection. 2. Would also obtain an MRI  of the brain without contrast to rule out right PCA territory ischemic lesion, since visual changes are not typical of though she has experienced previously with migraine headaches. 3. Symptomatic treatment of headache pain as needed. 4. Further neurological intervention if MRI shows or CT angiogram shows an acute abnormality.  C.R. Roseanne Reno, MD [facility] [phone] 03/25/2017, 5:03 AM

## 2017-03-25 NOTE — ED Triage Notes (Signed)
Pt reports H/A since 1600 yesterday. Steffanie RainwaterFiancee reports pt "heard her neck pop" and then began having a severe h/a. Pt denies similarity to previous migraines in the past. Reports nausea and photo sensitivity, as well as blurred vision in L vision that began an hour ago.

## 2017-03-25 NOTE — ED Notes (Signed)
ED Provider at bedside. 

## 2017-03-25 NOTE — ED Notes (Signed)
Taken to MRI 

## 2017-03-25 NOTE — ED Notes (Signed)
Patient transported to CT 

## 2017-03-25 NOTE — ED Provider Notes (Signed)
Assumed care from Dr. Wilkie AyeHorton at 7 AM. Briefly, the patient is a 51 y.o. female with PMHx of  has a past medical history of Hearing loss; Migraine; and Obesity. here with headache, blurred vision. Pt has an extensive h/o migraines, has been seen here before for this. She came in with left-sided blurred vision with HA, which is typical but worse than her usual migraine. She was also c/o neck pain. CT angio, head obtained and is negative. Lab work is reassuring. Pt given migraine meds, feels improved. Neuro consulted given her neuro deficits and suspect that this is 2/2 complex migraine, but recommended MRI. MR pending.   Labs Reviewed  COMPREHENSIVE METABOLIC PANEL - Abnormal; Notable for the following:       Result Value   Glucose, Bld 117 (*)    Calcium 8.7 (*)    ALT 10 (*)    All other components within normal limits  URINALYSIS, ROUTINE W REFLEX MICROSCOPIC - Abnormal; Notable for the following:    Leukocytes, UA TRACE (*)    Bacteria, UA RARE (*)    Squamous Epithelial / LPF 0-5 (*)    All other components within normal limits  I-STAT CHEM 8, ED - Abnormal; Notable for the following:    Chloride 99 (*)    Glucose, Bld 121 (*)    Calcium, Ion 1.06 (*)    All other components within normal limits  ETHANOL  PROTIME-INR  APTT  CBC  DIFFERENTIAL  RAPID URINE DRUG SCREEN, HOSP PERFORMED  I-STAT TROPOININ, ED    Course of Care: -MRI negative for acute abnormality. On my exam after MRI, pain now 6/10 and pt feels improved. Her vision changes have resolved and neurological exam is non-focal on my exam. She is ambulatory to BR without difficulty. Suspect complex migraine, now resolved. Per Dr. Wilkie AyeHorton and Neurology, low suspicion [facility]. Given negative CT head, ct angio, and MRI with resolution of pain in ED, I simialrly have a LOW [facility]. We discussed my suspicion/low likelihood in detail, and based on shared decision making re: pros/cons of further assessment via LP, pt declines  LP at this time and feels comfortable with treating as migraine. Will d/c with outpt medications, Neurology f/u, and good return precautions. Husband also aware, and in agreement with this plan.     Shaune PollackIsaacs, Thelma Lorenzetti, MD 03/25/17 [phone]

## 2017-03-25 NOTE — Discharge Instructions (Signed)
-   Drink plenty of fluids - take the medications as prescribed - Rest for the next 1-2 days - If your headache worsens or your vision changes return, return to the ER

## 2017-03-25 NOTE — ED Notes (Signed)
Patient at MRI 

## 2017-03-25 NOTE — ED Provider Notes (Signed)
[facility]-EMERGENCY DEPT Provider Note   CSN: [id number] Arrival date & time: 03/25/17  0357     History   Chief Complaint Chief Complaint  Patient presents with  . Headache  . Blurred Vision    HPI Kelli Allison is a 51 y.o. female.  HPI  This is a 51 year old female with a history of migraines who presents with headache and left eye vision complaints. Patient reports onset of headache at 4 PM yesterday. She states that she felt a crick in her neck and heard a pop.  She had acute onset of headache. 10 out of 10. She took ibuprofen for the pain with no relief. She reports that she woke up this morning at 1 AM to go to the restroom and noted blurry vision out of the left eye.  When asked if this headache feels like her migraines, she states "this feels different." She denies any vomiting. Denies any other neurologic deficits including speech difficulty, difficulty ambulating, weakness, numbness, tingling.  Past Medical History:  Diagnosis Date  . Hearing loss   . Migraine   . Obesity     Patient Active Problem List   Diagnosis Date Noted  . Obesity   . Abdominal pain, epigastric 01/16/2017  . Change in bowel habits 01/16/2017  . Generalized abdominal pain 12/31/2016  . Nonintractable episodic headache 12/31/2016  . Diarrhea 12/31/2016  . Allergic rhinitis 12/31/2016    Past Surgical History:  Procedure Laterality Date  . CESAREAN SECTION    . CHOLECYSTECTOMY    . INNER EAR SURGERY    . TUBAL LIGATION      OB History    No data available       Home Medications    Prior to Admission medications   Medication Sig Start Date End Date Taking? Authorizing Provider  acetaminophen (TYLENOL) 325 MG tablet Take 650 mg by mouth. 01/31/17   [provider]  cyclobenzaprine (FLEXERIL) 10 MG tablet Take 1 tablet (10 mg total) by mouth 3 (three) times daily as needed for muscle spasms. 03/11/17   Mechele Claude, MD  fluticasone (FLONASE) 50 MCG/ACT nasal spray Place 1  spray into both nostrils daily.    [provider]  glycopyrrolate (ROBINUL) 2 MG tablet Take 1 tablet (2 mg total) by mouth 2 (two) times daily. 02/12/17   Iva Boop, MD  ibuprofen (ADVIL,MOTRIN) 600 MG tablet Take 600 mg by mouth. 01/31/17   [provider]  loratadine (CLARITIN) 10 MG tablet Take 10 mg by mouth daily.    [provider]  Multiple Vitamin (MULITIVITAMIN WITH MINERALS) TABS Take 1 tablet by mouth daily.    [provider]  ondansetron (ZOFRAN ODT) 4 MG disintegrating tablet Take 1 tablet (4 mg total) by mouth every 8 (eight) hours as needed for nausea or vomiting. 01/10/17   Zehr, Princella Pellegrini, PA-C  pantoprazole (PROTONIX) 40 MG tablet Take 1 tablet (40 mg total) by mouth daily before breakfast. 02/14/17   Iva Boop, MD  predniSONE (DELTASONE) 10 MG tablet Take 5 daily for 3 days followed by 4,3,2 and 1 for 3 days each. 03/11/17   Mechele Claude, MD    Family History Family History  Problem Relation Age of Onset  . Alcohol abuse Mother   . Asthma Mother   . COPD Mother   . Depression Mother   . Drug abuse Mother   . Miscarriages / [place] Mother   . Depression Brother   . Learning disabilities Brother   .  Depression Daughter   . Miscarriages / [place] Daughter   . Birth defects Son        spinabifida, scoliosis  . Kidney disease Son   . Diabetes Maternal Grandmother   . Heart disease Maternal Grandmother   . Hyperlipidemia Maternal Grandmother   . Hypertension Maternal Grandmother   . Miscarriages / Stillbirths Maternal Grandmother   . Asthma Maternal Grandfather   . COPD Maternal Grandfather   . Cancer Paternal Grandmother   . Birth defects Brother        born with one kidney  . Depression Brother   . Learning disabilities Brother        dislexia    Social History Social History  Substance Use Topics  . Smoking status: Never Smoker  . Smokeless tobacco: Never Used  . Alcohol use Yes     Comment: occassional      Allergies   Aspirin; Caffeine; and Penicillins   Review of Systems Review of Systems  Constitutional: Negative for fever.  Respiratory: Negative for shortness of breath.   Cardiovascular: Negative for chest pain.  Neurological: Positive for headaches. Negative for dizziness, speech difficulty, weakness and numbness.  All other systems reviewed and are negative.    Physical Exam Updated Vital Signs BP 118/76 (BP Location: Right Arm)   Pulse 74   Temp 98.8 F (37.1 C) (Oral)   Resp 18   Ht 5\' 1"  (1.549 m)   Wt 87.1 kg (192 lb)   SpO2 97%   BMI 36.28 kg/m   Physical Exam  Constitutional: She is oriented to person, place, and time. No distress.  HENT:  Head: Normocephalic and atraumatic.  Eyes: Pupils are equal, round, and reactive to light.  Pupils 4 mm reactive bilaterally  Neck: Neck supple.  Cardiovascular: Normal rate, regular rhythm and normal heart sounds.   Pulmonary/Chest: Effort normal. No respiratory distress.  Abdominal: Soft. There is no tenderness.  Neurological: She is alert and oriented to person, place, and time.  Fluent speech, cranial nerves II through XII intact, 5 out of 5 strength right upper right lower, left lower, 4+ out of 5 with grip, biceps, triceps, drift noted left arm, no blink to threat on the left  Skin: Skin is warm and dry.  Psychiatric: She has a normal mood and affect.  Nursing note and vitals reviewed.    ED Treatments / Results  Labs (all labs ordered are listed, but only abnormal results are displayed) Labs Reviewed  ETHANOL  PROTIME-INR  APTT  CBC  DIFFERENTIAL  COMPREHENSIVE METABOLIC PANEL  RAPID URINE DRUG SCREEN, HOSP PERFORMED  URINALYSIS, ROUTINE W REFLEX MICROSCOPIC  I-STAT CHEM 8, ED  I-STAT TROPOININ, ED    EKG  EKG Interpretation None       Radiology No results found.  Procedures Procedures (including critical care time)  Medications Ordered in ED Medications  prochlorperazine  (COMPAZINE) injection 10 mg (not administered)  diphenhydrAMINE (BENADRYL) injection 25 mg (not administered)     Initial Impression / Assessment and Plan / ED Course  I have reviewed the triage vital signs and the nursing notes.  Pertinent labs & imaging results that were available during my care of the patient were reviewed by me and considered in my medical decision making (see chart for details).  Clinical Course as of Mar 26 301  Mon Mar 25, 2017  [id number] Discussed with Dr. Roseanne RenoStewart. He is evaluated the patient. He agrees with CTA head and neck. He also request MRI. Feels  this is likely consistent with a complicated migraine.  [CH]  1610 CTA reviewed. Discussed with the patient. MRI ordered. Patient reports persistent 9 out of 10 headache. She was redosed medication.  [CH]    Clinical Course User Index [CH] Casia Corti, Mayer Masker, MD    Patient presents with headache followed by vision changes. History of migraines but states this is different. She does have some focal deficit on exam including decreased vision in the left eye and left-sided drift. She is otherwise nontoxic. CT/CTA of the head and neck were ordered to rule out carotid dissection. She was evaluated by neurology. Symptoms thought to be consistent with common migraine. However, given neuro symptoms, will obtain MRI as well to rule out stroke. She was also given a migraine cocktail. See clinical course above. MRI pending. Patient was signed out to oncoming provider. At this time if her imaging is negative and her symptoms improved with treatment, feel she is very low risk for subarachnoid hemorrhage.     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

## 2017-03-29 ENCOUNTER — Encounter: Payer: Self-pay | Admitting: Physical Therapy

## 2017-03-29 ENCOUNTER — Ambulatory Visit: Payer: 59 | Attending: Family Medicine | Admitting: Physical Therapy

## 2017-03-29 DIAGNOSIS — M5416 Radiculopathy, lumbar region: Secondary | ICD-10-CM | POA: Insufficient documentation
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
## 2017-03-29 NOTE — Patient Instructions (Signed)
Piriformis (Supine)  Cross legs, right on top. Gently pull other knee toward chest until stretch is felt in buttock/hip of top leg. Hold ____ seconds. Repeat ____ times per set. Do ____ sets per session. Do ____ sessions per day.  Hip Stretch  Put right ankle over left knee. Let right knee fall downward, but keep ankle in place. Feel the stretch in hip. May push down gently with hand to feel stretch. Hold ____ seconds while counting out loud. Repeat with other leg. Repeat ____ times. Do ____ sessions per day.  Leg Extension (Hamstring)   Sit toward front edge of chair, with leg out straight, heel on floor or small stool, toes pointing toward body. Keeping back straight, bend forward at hip, until a stretch is felt. Hold 30-60 seconds. Repeat _3__ times. Repeat with other leg. Do __2-3_ sessions per day.   Trigger Point Dry Needling  . What is Trigger Point Dry Needling (DN)? o DN is a physical therapy technique used to treat muscle pain and dysfunction. Specifically, DN helps deactivate muscle trigger points (muscle knots).  o A thin filiform needle is used to penetrate the skin and stimulate the underlying trigger point. The goal is for a local twitch response (LTR) to occur and for the trigger point to relax. No medication of any kind is injected during the procedure.   . What Does Trigger Point Dry Needling Feel Like?  o The procedure feels different for each individual patient. Some patients report that they do not actually feel the needle enter the skin and overall the process is not painful. Very mild bleeding may occur. However, many patients feel a deep cramping in the muscle in which the needle was inserted. This is the local twitch response.   Marland Kitchen. How Will I feel after the treatment? o Soreness is normal, and the onset of soreness may not occur for a few hours. Typically this soreness does not last longer than two days.  o Bruising is uncommon, however; ice can be used to decrease  any possible bruising.  o In rare cases feeling tired or nauseous after the treatment is normal. In addition, your symptoms may get worse before they get better, this period will typically not last longer than 24 hours.   . What Can I do After My Treatment? o Increase your hydration by drinking more water for the next 24 hours. o You may place ice or heat on the areas treated that have become sore, however, do not use heat on inflamed or bruised areas. Heat often brings more relief post needling. o You can continue your regular activities, but vigorous activity is not recommended initially after the treatment for 24 hours. o DN is best combined with other physical therapy such as strengthening, stretching, and other therapies.    Precautions:  In some cases, dry needling is done over the lung field. While rare, there is a risk of pneumothorax (punctured lung). Because of this, if you ever experience shortness of breath on exertion, difficulty taking a deep breath, chest pain or a dry cough following dry needling, you should report to an emergency room and tell them that you have been dry needled over the thorax.   Solon PalmJulie Nazarene Bunning, PT 03/29/17 8:58 AM [facility] [address] [place], [place], [address] Phone: [phone]   Fax:  [phone]

## 2017-04-10 ENCOUNTER — Ambulatory Visit (INDEPENDENT_AMBULATORY_CARE_PROVIDER_SITE_OTHER): Payer: 59 | Admitting: Physical Therapy

## 2017-04-10 ENCOUNTER — Encounter: Payer: Self-pay | Admitting: Physical Therapy

## 2017-04-10 DIAGNOSIS — M5416 Radiculopathy, lumbar region: Secondary | ICD-10-CM | POA: Diagnosis not present

## 2017-04-10 NOTE — Therapy (Signed)
[place], [place], [address] Phone: [phone]   Fax:  [phone]  Physical Therapy Treatment  Patient Details  Name: Kelli Allison MRN: [id number] Date of Birth: 1965-09-24 Referring Provider: Dr Claretta Fraise  Encounter Date: 04/10/2017      PT End of Session - 04/10/17 1607    Visit Number 2   Number of Visits 12   Date for PT Re-Evaluation 05/10/17   PT Start Time 6834   PT Stop Time 1700   PT Time Calculation (min) 53 min   Activity Tolerance Patient tolerated treatment well      Past Medical History:  Diagnosis Date  . Hearing loss   . Migraine   . Obesity     Past Surgical History:  Procedure Laterality Date  . CESAREAN SECTION    . CHOLECYSTECTOMY    . INNER EAR SURGERY    . TUBAL LIGATION      There were no vitals filed for this visit.      Subjective Assessment - 04/10/17 1607    Subjective Pt reports she works about 10' down the road so coming to our facility is easier after work.  She has been doing her HEP , she feels about the same.    Patient Stated Goals to get rid of pain and numbness   Currently in Pain? Yes   Pain Score 7    Pain Location Back   Pain Orientation Lower;Mid   Pain Descriptors / Indicators Aching   Pain Type Chronic pain   Pain Onset More than a month ago   Pain Frequency Intermittent   Aggravating Factors  sitting at work, doing a lot of yard work and bending over.    Pain Relieving Factors TENS unit, uses about twice a week.            [facility] PT Assessment - 04/10/17 0001      Assessment   Medical Diagnosis lumbar radiculopathy   Referring Provider Dr Claretta Fraise   Onset Date/Surgical Date 08/10/08     Palpation   Palpation comment tender in Rt lumbar paraspinals, into gluts, piriformis and hamstring                     [facility] Adult PT Treatment/Exercise - 04/10/17 0001      Exercises   Exercises Lumbar     Lumbar Exercises: Stretches   Piriformis Stretch 3 reps;30 seconds     Lumbar Exercises: Aerobic   Stationary Bike nustep L5 x 5'     Modalities   Modalities Electrical Stimulation;Moist Heat     Moist Heat Therapy   Number Minutes Moist Heat 15 Minutes   Moist Heat Location Lumbar Spine  buttocks     Electrical Stimulation   Electrical Stimulation Location Rt low back and buttocks   Electrical Stimulation Action IFC   Electrical Stimulation Parameters to tolerance   Electrical Stimulation Goals Tone;Pain     Manual Therapy   Manual Therapy Soft tissue mobilization   Soft tissue mobilization STM with trigger point release to Rt low back, QL and gluts.  She was able to tolerate deeper pressure after DN as compared to before.           Trigger Point Dry Needling - 04/10/17 1645    Consent Given? Yes   Education Handout Provided Yes   Muscles Treated Lower Body Gluteus maximus  QL Rt side   Gluteus Maximus Response  Palpable increased muscle length;Twitch response elicited  very tender in the Rt QL - good releases.                    PT Long Term Goals - 04/10/17 1613      PT LONG TERM GOAL #1   Title I with HEP for flexibility and strength   Status On-going     PT LONG TERM GOAL #2   Title Patient able to sit at work with no reports of numbness in LE.   Status On-going     PT LONG TERM GOAL #3   Title Patient able to report decreased incidence of back pain by 85% or more with ADLs   Status On-going               Plan - 04/10/17 1648    Clinical Impression Statement This is Cledith's second visit, no goals met.  She is performing her HEP.  She tolerated DN well and was able to handle deeper manual work after this as compared to before. She continues to have tightness and would benefit from continued treatment for manual work with progression to core strengthening.    History and Personal Factors relevant to plan of care: hard of hear - partially  reads lips.    Rehab Potential Excellent   PT Frequency 2x / week   PT Duration 6 weeks   PT Treatment/Interventions ADLs/Self Care Home Management;Electrical Stimulation;Cryotherapy;Moist Heat;Traction;Ultrasound;Therapeutic exercise;Neuromuscular re-education;Manual techniques;Patient/family education;Dry needling   PT Next Visit Plan assess response to DN continue PRN   Consulted and Agree with Plan of Care Family member/caregiver      Patient will benefit from skilled therapeutic intervention in order to improve the following deficits and impairments:  Pain, Decreased strength, Decreased range of motion, Impaired flexibility, Postural dysfunction  Visit Diagnosis: Radiculopathy, lumbar region     Problem List Patient Active Problem List   Diagnosis Date Noted  . Obesity   . Abdominal pain, epigastric 01/16/2017  . Change in bowel habits 01/16/2017  . Generalized abdominal pain 12/31/2016  . Nonintractable episodic headache 12/31/2016  . Diarrhea 12/31/2016  . Allergic rhinitis 12/31/2016    Jeral Pinch PT  04/10/2017, 4:56 PM  [facility] [place], [place], [address] Phone: [phone]   Fax:  [phone]  Name: SANAI FRICK MRN: [id number] Date of Birth: May 09, 1966

## 2017-04-12 ENCOUNTER — Ambulatory Visit (INDEPENDENT_AMBULATORY_CARE_PROVIDER_SITE_OTHER): Payer: 59 | Admitting: Physical Therapy

## 2017-04-12 ENCOUNTER — Encounter: Payer: Self-pay | Admitting: Physical Therapy

## 2017-04-12 DIAGNOSIS — M5416 Radiculopathy, lumbar region: Secondary | ICD-10-CM | POA: Diagnosis not present

## 2017-04-12 NOTE — Patient Instructions (Signed)
  Abdominal Bracing With Pelvic Floor (Hook-Lying)   With neutral spine, tighten pelvic floor and abdominals. Hold 5-10 seconds. Repeat __10_ times. Do _1__ times a day.   Knee to Chest: Transverse Plane Stability   Bring one knee up, then return. Be sure pelvis does not roll side to side. Keep pelvis still. Lift knee __10_ times each leg. Restabilize pelvis. Repeat with other leg. Do _1-2__ sets, _1__ times per day.   Hip External Rotation With Pillow: Transverse Plane Stability   One knee bent, one leg straight, on pillow. Slowly roll bent knee out. Be sure pelvis does not rotate. Do _10__ times. Restabilize pelvis. Repeat with other leg. Do _1-2__ sets, _1__ times per day.   [facility] [address] [place], [place] [address]  [phone] (office) [phone] (fax)   

## 2017-04-12 NOTE — Therapy (Signed)
[facility] [address] [place], [place], [address] Phone: [phone]   Fax:  [phone]  Physical Therapy Treatment  Patient Details  Name: Kelli Allison MRN: [id number] Date of Birth: December 15, 1965 Referring Provider: Dr Mechele Claude  Encounter Date: 04/12/2017      PT End of Session - 04/12/17 1450    Visit Number 3   Number of Visits 12   Date for PT Re-Evaluation 05/10/17   PT Start Time 1447   PT Stop Time 1549   PT Time Calculation (min) 62 min   Activity Tolerance Patient limited by pain      Past Medical History:  Diagnosis Date  . Hearing loss   . Migraine   . Obesity     Past Surgical History:  Procedure Laterality Date  . CESAREAN SECTION    . CHOLECYSTECTOMY    . INNER EAR SURGERY    . TUBAL LIGATION      There were no vitals filed for this visit.      Subjective Assessment - 04/12/17 1450    Subjective Pt reports her back feels a little better, was sore until the next AM    Patient Stated Goals to get rid of pain and numbness   Currently in Pain? Yes   Pain Score 6    Pain Location Back   Pain Orientation Lower;Mid   Pain Descriptors / Indicators Aching   Pain Type Chronic pain   Pain Onset More than a month ago   Pain Frequency Intermittent                         [facility] Adult PT Treatment/Exercise - 04/12/17 0001      Lumbar Exercises: Stretches   Passive Hamstring Stretch 2 reps;30 seconds   Piriformis Stretch 1 rep;30 seconds  modified stretch     Lumbar Exercises: Aerobic   Stationary Bike nustep L5 x 5'     Lumbar Exercises: Supine   Ab Set 10 reps;5 seconds   Clam 10 reps   Bent Knee Raise 10 reps     Lumbar Exercises: Sidelying   Clam 10 reps  followed by 10 reps reverse clams     Modalities   Modalities Electrical Stimulation;Moist Heat;Ultrasound     Moist Heat Therapy   Number Minutes Moist Heat 15 Minutes   Moist Heat Location Lumbar  Spine  & buttocks     Electrical Stimulation   Electrical Stimulation Location bilat low back and buttocks   Electrical Stimulation Action IFC   Electrical Stimulation Parameters to tolerance   Electrical Stimulation Goals Tone;Pain     Ultrasound   Ultrasound Location 5' each Rt piriformis and Rt lumbar paraspinals   Ultrasound Parameters combo 100%, 1.108mhZ, 1.0w/cm2   Ultrasound Goals Pain  tone     Manual Therapy   Manual Therapy Soft tissue mobilization   Soft tissue mobilization attempted STM to Rt low back and buttocks, - pt very jumpy.                 PT Education - 04/12/17 1455    Education provided Yes   Education Details TA bracing   Person(s) Educated Patient   Methods Explanation;Handout   Comprehension Returned demonstration;Verbalized understanding             PT Long Term Goals - 04/10/17 1613      PT LONG TERM GOAL #1   Title I with HEP  for flexibility and strength   Status On-going     PT LONG TERM GOAL #2   Title Patient able to sit at work with no reports of numbness in LE.   Status On-going     PT LONG TERM GOAL #3   Title Patient able to report decreased incidence of back pain by 85% or more with ADLs   Status On-going               Plan - 04/12/17 1553    Clinical Impression Statement Kelli Allison had limited improvement from the last session.  Added some initial core work to her HEP to help start stabilizing her low back.  Still very sensative to manual work Rt low back and buttocks.  Tolerated combo and stim well.    History and Personal Factors relevant to plan of care: hard of hearing - paritally reads lips.    Rehab Potential Excellent   PT Frequency 2x / week   PT Duration 6 weeks   PT Treatment/Interventions ADLs/Self Care Home Management;Electrical Stimulation;Cryotherapy;Moist Heat;Traction;Ultrasound;Therapeutic exercise;Neuromuscular re-education;Manual techniques;Patient/family education;Dry needling   PT Next Visit  Plan assess response to DN continue PRN   Consulted and Agree with Plan of Care Patient      Patient will benefit from skilled therapeutic intervention in order to improve the following deficits and impairments:  Pain, Decreased strength, Decreased range of motion, Impaired flexibility, Postural dysfunction  Visit Diagnosis: Radiculopathy, lumbar region     Problem List Patient Active Problem List   Diagnosis Date Noted  . Obesity   . Abdominal pain, epigastric 01/16/2017  . Change in bowel habits 01/16/2017  . Generalized abdominal pain 12/31/2016  . Nonintractable episodic headache 12/31/2016  . Diarrhea 12/31/2016  . Allergic rhinitis 12/31/2016    Roderic Scarce PT  04/12/2017, 3:57 PM  [facility] [address] [place], [place], [address] Phone: [phone]   Fax:  [phone]  Name: Kelli Allison MRN: [id number] Date of Birth: 1966/07/07

## 2017-04-15 ENCOUNTER — Ambulatory Visit (INDEPENDENT_AMBULATORY_CARE_PROVIDER_SITE_OTHER): Payer: 59 | Admitting: Physical Therapy

## 2017-04-15 DIAGNOSIS — M5416 Radiculopathy, lumbar region: Secondary | ICD-10-CM

## 2017-04-15 NOTE — Therapy (Signed)
[address] [place], [place], [address] Phone: [phone]   Fax:  [phone]  Physical Therapy Treatment  Patient Details  Name: Kelli Allison MRN: [id number] Date of Birth: July 07, 1966 Referring Provider: Dr Claretta Fraise  Encounter Date: 04/15/2017      PT End of Session - 04/15/17 1453    Visit Number 4   Number of Visits 12   Date for PT Re-Evaluation 05/10/17   PT Start Time 3833  in late   PT Stop Time 1517   PT Time Calculation (min) 24 min   Activity Tolerance Patient tolerated treatment well      Past Medical History:  Diagnosis Date  . Hearing loss   . Migraine   . Obesity     Past Surgical History:  Procedure Laterality Date  . CESAREAN SECTION    . CHOLECYSTECTOMY    . INNER EAR SURGERY    . TUBAL LIGATION      There were no vitals filed for this visit.      Subjective Assessment - 04/15/17 1456    Subjective Pt reports she is doing well, not having any pain unless she touches her back.    Patient Stated Goals to get rid of pain and numbness   Currently in Pain? No/denies            [facility] PT Assessment - 04/15/17 0001      Assessment   Medical Diagnosis lumbar radiculopathy   Referring Provider Dr Claretta Fraise   Onset Date/Surgical Date 08/10/08     AROM   AROM Assessment Site Lumbar   Lumbar Flexion fingertips 5" from the floor   Lumbar - Left Side Bend limited 25% with pain/stretch in Rt low back                     [facility] Adult PT Treatment/Exercise - 04/15/17 0001      Lumbar Exercises: Stretches   Passive Hamstring Stretch 2 reps;30 seconds   Passive Hamstring Stretch Limitations 2x30sec hip flexor stretch in low lunge.    ITB Stretch 2 reps;30 seconds  cross body with strap   Piriformis Stretch 2 reps;30 seconds     Lumbar Exercises: Aerobic   Stationary Bike nustep L5 x 5'     Lumbar Exercises: Supine   Bridge 20 reps  knees in/out  with green band.    Large Ball Abdominal Isometric 3 seconds;10 reps  alternating sides     Lumbar Exercises: Prone   Opposite Arm/Leg Raise Left arm/Right leg;Right arm/Left leg;10 reps     Lumbar Exercises: Quadruped   Opposite Arm/Leg Raise Left arm/Right leg;Right arm/Left leg;10 reps  VC for form                     PT Long Term Goals - 04/15/17 1458      PT LONG TERM GOAL #1   Title I with HEP for flexibility and strength   Status On-going     PT LONG TERM GOAL #2   Title Patient able to sit at work with no reports of numbness in LE.   Status Achieved  as of today 04/15/17     PT LONG TERM GOAL #3   Title Patient able to report decreased incidence of back pain by 85% or more with ADLs               Plan - 04/15/17 1459  Clinical Impression Statement [name] has made good progress, she has met a goal, not having any pain or numbness in her back and LE. Also demo increased lumbar ROM. She is still weak in her core and would benefit from more strengthening and to see if lack of pain is sustainable.    Rehab Potential Excellent   PT Frequency 2x / week   PT Duration 6 weeks   PT Treatment/Interventions ADLs/Self Care Home Management;Electrical Stimulation;Cryotherapy;Moist Heat;Traction;Ultrasound;Therapeutic exercise;Neuromuscular re-education;Manual techniques;Patient/family education;Dry needling      Patient will benefit from skilled therapeutic intervention in order to improve the following deficits and impairments:  Pain, Decreased strength, Decreased range of motion, Impaired flexibility, Postural dysfunction  Visit Diagnosis: Radiculopathy, lumbar region     Problem List Patient Active Problem List   Diagnosis Date Noted  . Obesity   . Abdominal pain, epigastric 01/16/2017  . Change in bowel habits 01/16/2017  . Generalized abdominal pain 12/31/2016  . Nonintractable episodic headache 12/31/2016  . Diarrhea 12/31/2016  . Allergic  rhinitis 12/31/2016    Jeral Pinch PT  04/15/2017, 3:17 PM  [facility] [place], [place], [address] Phone: [phone]   Fax:  [phone]  Name: Kelli Allison MRN: [id number] Date of Birth: 09-01-1966

## 2017-04-23 ENCOUNTER — Ambulatory Visit (INDEPENDENT_AMBULATORY_CARE_PROVIDER_SITE_OTHER): Payer: 59 | Admitting: Internal Medicine

## 2017-04-23 ENCOUNTER — Encounter: Payer: Self-pay | Admitting: Internal Medicine

## 2017-04-23 ENCOUNTER — Other Ambulatory Visit (INDEPENDENT_AMBULATORY_CARE_PROVIDER_SITE_OTHER): Payer: 59

## 2017-04-23 VITALS — BP 100/70 | HR 72 | Ht 61.0 in | Wt 200.0 lb

## 2017-04-23 DIAGNOSIS — R946 Abnormal results of thyroid function studies: Secondary | ICD-10-CM

## 2017-04-23 DIAGNOSIS — K219 Gastro-esophageal reflux disease without esophagitis: Secondary | ICD-10-CM

## 2017-04-23 DIAGNOSIS — K222 Esophageal obstruction: Secondary | ICD-10-CM

## 2017-04-23 DIAGNOSIS — K588 Other irritable bowel syndrome: Secondary | ICD-10-CM | POA: Diagnosis not present

## 2017-04-23 DIAGNOSIS — R7989 Other specified abnormal findings of blood chemistry: Secondary | ICD-10-CM

## 2017-04-23 LAB — T4, FREE: Free T4: 0.45 ng/dL — ABNORMAL LOW (ref 0.60–1.60)

## 2017-04-23 LAB — TSH: TSH: 11.39 u[IU]/mL — ABNORMAL HIGH (ref 0.35–4.50)

## 2017-04-23 MED ORDER — GLYCOPYRROLATE 2 MG PO TABS: 2.0000 mg | ORAL_TABLET | Freq: Two times a day (BID) | ORAL | 3 refills | Status: DC | PRN

## 2017-04-23 MED ORDER — PANTOPRAZOLE SODIUM 40 MG PO TBEC: 40.0000 mg | DELAYED_RELEASE_TABLET | Freq: Every day | ORAL | 3 refills | Status: DC

## 2017-04-23 NOTE — Progress Notes (Signed)
Kelli FrancoKennie J Allison 51 y.o. Oct 01, 1965 [id number]  Assessment & Plan:   Encounter Diagnoses  Name Primary?  . GERD with stricture Yes  . Other irritable bowel syndrome   . Elevated TSH     Continue pantoprazole 40mg  daily  Change glycopyrrolate to PRN.  Use as needed for cramping/ spasm like pain  Re-check TSH, Free T4  Ms Kelli Allison is much improved today.  Her abdominal pain and pain with bowel movements have completely resolved.  We will reduce her antispasmotic medicine to PRN only.  She has some chronic mild weight gain- we will assess her TSH, free T4 today as TSH was 6.67 back in May 2018.  I have personally seen the patient, reviewed and repeated key elements of the history and physical and participated in formation of the assessment and plan the student has documented. WU:[id number]:Stacks, Broadus JohnWarren, MD   Subjective:   Chief Complaint: Follow up: abdominal pain/ esophageal stricture.    HPI Ms Kelli Allison is a 51 yo female here for follow up of severe lower abdominal pain and GERD with stricture.  Since starting glycopyrrolate and pantoprazole after her esophageal dilation on 02/14/17 her symptoms have resolved.  She denies dysphagia, reflux, pain, nausea, vomiting.  She denies pain with BM, constipation, diarrhea, hematochezia, and melena.  She is greatly improved and her only complaint today is mild weight gain.  She had a mildly elevated TSH in May (6.67).   Colonoscopy on 02/12/17 that was normal.  EGD with dilation on 02/14/17:  A moderate Schatzki ring (acquired) was found at the gastroesophageal junction. A TTS dilator was passed through the scope. Dilation with an 18-19-20 mm balloon dilator was performed to 20 mm. The dilation site was examined and showed mild improvement in luminal narrowing. Estimated blood loss was minimal. Findings: - LA Grade A (one or more mucosal breaks less than 5 mm, not extending between tops of 2 mucosal folds) esophagitis with no bleeding was found in the  distal esophagus. Biopsies were taken with a cold forceps for histology. Estimated blood loss was minimal. - Multiple dispersed, small non-bleeding erosions were found in the prepyloric region of the stomach. There were stigmata of recent bleeding. Biopsies were taken with a cold forceps for histology.  Allergies  Allergen Reactions  . Aspirin Nausea Only  . Caffeine Other (See Comments)    Or the color in sodas-causes GI problems  . Penicillins Hives    Has patient had a PCN reaction causing immediate rash, facial/tongue/throat swelling, SOB or lightheadedness with hypotension: YES Has patient had a PCN reaction causing severe rash involving mucus membranes or skin necrosis: NO Has patient had a PCN reaction that required hospitalization NO Has patient had a PCN reaction occurring within the last 10 years:NO If all of the above answers are "NO", then may proceed with Cephalosporin use.   Current Meds  Medication Sig  . butalbital-acetaminophen-caffeine (FIORICET, ESGIC) 50-325-40 MG tablet Take 1 tablet by mouth every 6 (six) hours as needed for headache.  . cyclobenzaprine (FLEXERIL) 10 MG tablet Take 1 tablet (10 mg total) by mouth 3 (three) times daily as needed for muscle spasms.  Kelli Allison. glycopyrrolate (ROBINUL) 2 MG tablet Take 1 tablet (2 mg total) by mouth 2 (two) times daily.  . ondansetron (ZOFRAN ODT) 4 MG disintegrating tablet Take 1 tablet (4 mg total) by mouth every 8 (eight) hours as needed for nausea or vomiting.  . pantoprazole (PROTONIX) 40 MG tablet Take 1 tablet (40 mg total) by mouth  daily before breakfast.  . promethazine (PHENERGAN) 25 MG tablet Take 1 tablet (25 mg total) by mouth every 6 (six) hours as needed for nausea or vomiting.   Past Medical History:  Diagnosis Date  . Hearing loss   . Migraine   . Obesity    Past Surgical History:  Procedure Laterality Date  . CESAREAN SECTION    . CHOLECYSTECTOMY    . INNER EAR SURGERY    . TUBAL LIGATION     Social  History   Social History  . Marital status: Divorced    Spouse name: N/A  . Number of children: 2  . Years of education: N/A   Occupational History  . Not on file.   Social History Main Topics  . Smoking status: Never Smoker  . Smokeless tobacco: Never Used  . Alcohol use Yes     Comment: occassional  . Drug use: No  . Sexual activity: Yes    Birth control/ protection: Surgical   Other Topics Concern  . Not on file   Social History Narrative  . No narrative on file   family history includes Alcohol abuse in her mother; Asthma in her maternal grandfather and mother; Birth defects in her brother and son; COPD in her maternal grandfather and mother; Cancer in her paternal grandmother; Depression in her brother, brother, daughter, and mother; Diabetes in her maternal grandmother; Drug abuse in her mother; Heart disease in her maternal grandmother; Hyperlipidemia in her maternal grandmother; Hypertension in her maternal grandmother; Kidney disease in her son; Learning disabilities in her brother and brother; Miscarriages / [place] in her daughter, maternal grandmother, and mother.   Review of Systems Positive for weight gain despite exercise and diet All other systems negative See HPI for details  Objective:   Physical Exam @BP  100/70   Pulse 72   Ht 5\' 1"  (1.549 m)   Wt 200 lb (90.7 kg)   BMI 37.79 kg/m @  General:  NAD Eyes:   anicteric Lungs:  clear Heart::  S1S2 no rubs, murmurs or gallops Abdomen:  soft and nontender, BS+ Ext:   no edema, cyanosis or clubbing   Data Reviewed:  EGD 02/14/2017 Colonoscopy 02/12/2017 Lab Results  Component Value Date   TSH 6.67 (H) 01/10/2017   Flint Melter, PA-S [facility]

## 2017-04-23 NOTE — Patient Instructions (Signed)
   Glad things are better.  I refilled pantoprazole and glycopyrrolate x 1 year. You may try the glycopyrrolate as needed.  Please go to the lab to have thyroid levels checked and I will let you know the results soon.  I appreciate the opportunity to care for you.  Iva Booparl E. Francesca Strome, MD, Clementeen GrahamFACG

## 2017-04-24 ENCOUNTER — Encounter: Payer: 59 | Admitting: Physical Therapy

## 2017-04-25 ENCOUNTER — Encounter: Payer: Self-pay | Admitting: Physical Therapy

## 2017-04-25 ENCOUNTER — Ambulatory Visit (INDEPENDENT_AMBULATORY_CARE_PROVIDER_SITE_OTHER): Payer: 59 | Admitting: Physical Therapy

## 2017-04-25 DIAGNOSIS — M5416 Radiculopathy, lumbar region: Secondary | ICD-10-CM | POA: Diagnosis not present

## 2017-04-25 NOTE — Therapy (Signed)
[place], [place], [address] Phone: [phone]   Fax:  [phone]  Physical Therapy Treatment  Patient Details  Name: Kelli Allison MRN: [id number] Date of Birth: [birth year] Referring Provider: Dr Claretta Fraise  Encounter Date: 04/25/2017      PT End of Session - 04/25/17 1702    Visit Number 5   Number of Visits 12   Date for PT Re-Evaluation 05/10/17   PT Start Time 1702   PT Stop Time 1730   PT Time Calculation (min) 28 min   Activity Tolerance Patient tolerated treatment well      Past Medical History:  Diagnosis Date  . Hearing loss   . Migraine   . Obesity     Past Surgical History:  Procedure Laterality Date  . CESAREAN SECTION    . CHOLECYSTECTOMY    . INNER EAR SURGERY    . TUBAL LIGATION      There were no vitals filed for this visit.      Subjective Assessment - 04/25/17 1703    Subjective Kelli Allison reports she is doing really well, hasn't had back pain in about 2 wks. She is albe to perform all her normal activities.    Currently in Pain? No/denies            [facility] PT Assessment - 04/25/17 0001      Assessment   Medical Diagnosis lumbar radiculopathy   Referring Provider Dr Claretta Fraise   Onset Date/Surgical Date 08/10/08     AROM   AROM Assessment Site Lumbar   Lumbar Flexion fingertips 2" from foot   Lumbar - Right Side Bend WNL   Lumbar - Left Side Bend WNL   Lumbar - Right Rotation WNL   Lumbar - Left Rotation WNL     Strength   Overall Strength Comments bilat LE's WNL     Flexibility   Hamstrings supine SLR Rt 85, Lt 80                     [facility] Adult PT Treatment/Exercise - 04/25/17 0001      Lumbar Exercises: Stretches   Active Hamstring Stretch 2 reps;30 seconds  each side with strap   Single Knee to Chest Stretch 2 reps;20 seconds  each side   Piriformis Stretch 30 seconds  cross body with strap     Lumbar  Exercises: Aerobic   Stationary Bike nustep L5 x 5'     Lumbar Exercises: Supine   Bridge --  30 reps   Other Supine Lumbar Exercises pilates single leg circles, scissors     Lumbar Exercises: Quadruped   Opposite Arm/Leg Raise Left arm/Right leg;Right arm/Left leg;20 reps                PT Education - 04/25/17 1723    Education provided Yes   Education Details HEP progression   Person(s) Educated Patient   Methods Explanation;Demonstration;Handout   Comprehension Returned demonstration;Verbalized understanding             PT Long Term Goals - 04/25/17 1706      PT LONG TERM GOAL #1   Title I with HEP for flexibility and strength   Status Achieved     PT LONG TERM GOAL #2   Title Patient able to sit at work with no reports of numbness in LE.   Status Achieved     PT LONG TERM GOAL #3  Title Patient able to report decreased incidence of back pain by 85% or more with ADLs   Status Achieved  90% improved               Plan - 04/25/17 1731    Clinical Impression Statement Kelli Allison has down excellent, all goals met and she has been pain free for two weeks.  She does fatigue quickly with core strengthening however feels like she can perform her HEP on her own.    PT Next Visit Plan discharge to HEP    Consulted and Agree with Plan of Care Patient      Patient will benefit from skilled therapeutic intervention in order to improve the following deficits and impairments:     Visit Diagnosis: Radiculopathy, lumbar region     Problem List Patient Active Problem List   Diagnosis Date Noted  . Obesity   . Abdominal pain, epigastric 01/16/2017  . Change in bowel habits 01/16/2017  . Generalized abdominal pain 12/31/2016  . Nonintractable episodic headache 12/31/2016  . Diarrhea 12/31/2016  . Allergic rhinitis 12/31/2016    Kelli Allison,Kelli Allison 04/25/2017, 5:33 PM  [facility] [place], [place], [address] Phone: [phone]   Fax:  [phone]  Name: Kelli Allison MRN: [id number] Date of Birth: [birth year]   PHYSICAL THERAPY DISCHARGE SUMMARY  Visits from Start of Care: 5 Current functional level related to goals / functional outcomes: See above for current strength and ROM measurements   Remaining deficits: none   Education / Equipment: HEP Plan: Patient agrees to discharge.  Patient goals were met. Patient is being discharged due to meeting the stated rehab goals.  ?????    Jeral Pinch, PT 04/25/17 5:33 PM

## 2017-04-25 NOTE — Patient Instructions (Addendum)
Upper / Lower Extremity Extension (All-Fours)    Tighten stomach and raise right leg and opposite arm. Keep trunk rigid. Repeat __10__ times per set. Do __2__ sets per session. Do __3-4__ sessions per week.  PELVIC STABILIZATION: Basic Bridge    Exhaling, lift hips. Lower hips back to floor. Repeat _3x10__ times. Do __3-4_ times per week.  Bridging: with Straight Leg Raise - Start this when the one above is easy.     With legs bent, lift buttocks ____ inches from floor. Then slowly extend right knee, keeping stomach tight. Repeat ____ times per set. Do ____ sets per session. Do ____ sessions per day.  Single Leg Circle (Intermediate / Advanced)    Lie on back, leg extended on mat, other leg straight up. Inhale, circling leg across body, and exhale as you finish circling down and around to beginning. Maintain still pelvis; avoid rocking. Keep circle small. Repeat _10___ times clockwise, then counterclockwise. Repeat with other leg. Do __3-4 __ sessions per week.   Scissor (Intermediate / Advanced)    Lie on back, legs straight up. Round up torso, reach up and hold ankle. Lower other leg to 45. Exhale, pulling gently on leg twice. Inhale, switching legs. Repeat ___10_ times, alternating legs. Do  3-4 times a week NOTE: Keep navel to spine, back flat.

## 2017-04-25 NOTE — Progress Notes (Signed)
Tell her thyroid is not working correctly and she needs to go to PCP and start treatment

## 2017-05-01 ENCOUNTER — Ambulatory Visit: Payer: 59 | Admitting: Family Medicine

## 2017-05-01 ENCOUNTER — Encounter (HOSPITAL_COMMUNITY): Payer: Self-pay

## 2017-05-01 ENCOUNTER — Emergency Department (HOSPITAL_COMMUNITY)
Admission: EM | Admit: 2017-05-01 | Discharge: 2017-05-01 | Disposition: A | Discharge status: Home / Self Care | Payer: 59 | Attending: Emergency Medicine | Admitting: Emergency Medicine

## 2017-05-01 DIAGNOSIS — M62830 Muscle spasm of back: Secondary | ICD-10-CM | POA: Insufficient documentation

## 2017-05-01 DIAGNOSIS — X58XXXA Exposure to other specified factors, initial encounter: Secondary | ICD-10-CM | POA: Insufficient documentation

## 2017-05-01 DIAGNOSIS — S39012A Strain of muscle, fascia and tendon of lower back, initial encounter: Secondary | ICD-10-CM | POA: Diagnosis not present

## 2017-05-01 DIAGNOSIS — R1032 Left lower quadrant pain: Secondary | ICD-10-CM | POA: Diagnosis present

## 2017-05-01 DIAGNOSIS — T148XXA Other injury of unspecified body region, initial encounter: Secondary | ICD-10-CM

## 2017-05-01 DIAGNOSIS — Y929 Unspecified place or not applicable: Secondary | ICD-10-CM | POA: Diagnosis not present

## 2017-05-01 DIAGNOSIS — Y999 Unspecified external cause status: Secondary | ICD-10-CM | POA: Diagnosis not present

## 2017-05-01 DIAGNOSIS — R1012 Left upper quadrant pain: Secondary | ICD-10-CM | POA: Diagnosis not present

## 2017-05-01 DIAGNOSIS — Y939 Activity, unspecified: Secondary | ICD-10-CM | POA: Insufficient documentation

## 2017-05-01 LAB — CBC WITH DIFFERENTIAL/PLATELET
BASOS ABS: 0 10*3/uL (ref 0.0–0.1)
Basophils Relative: 0 %
Eosinophils Absolute: 0.1 10*3/uL (ref 0.0–0.7)
Eosinophils Relative: 1 %
HCT: 43.6 % (ref 36.0–46.0)
HEMOGLOBIN: 14.6 g/dL (ref 12.0–15.0)
LYMPHS ABS: 2.5 10*3/uL (ref 0.7–4.0)
LYMPHS PCT: 23 %
MCH: 30.2 pg (ref 26.0–34.0)
MCHC: 33.5 g/dL (ref 30.0–36.0)
MCV: 90.3 fL (ref 78.0–100.0)
Monocytes Absolute: 0.9 10*3/uL (ref 0.1–1.0)
Monocytes Relative: 8 %
NEUTROS PCT: 68 %
Neutro Abs: 7.4 10*3/uL (ref 1.7–7.7)
PLATELETS: 240 10*3/uL (ref 150–400)
RBC: 4.83 MIL/uL (ref 3.87–5.11)
RDW: 14.6 % (ref 11.5–15.5)
WBC: 11 10*3/uL — AB (ref 4.0–10.5)

## 2017-05-01 LAB — COMPREHENSIVE METABOLIC PANEL
ALT: 14 U/L (ref 14–54)
ANION GAP: 8 (ref 5–15)
AST: 21 U/L (ref 15–41)
Albumin: 4.5 g/dL (ref 3.5–5.0)
Alkaline Phosphatase: 81 U/L (ref 38–126)
BUN: 11 mg/dL (ref 6–20)
CHLORIDE: 105 mmol/L (ref 101–111)
CO2: 25 mmol/L (ref 22–32)
Calcium: 9.7 mg/dL (ref 8.9–10.3)
Creatinine, Ser: 0.81 mg/dL (ref 0.44–1.00)
Glucose, Bld: 84 mg/dL (ref 65–99)
POTASSIUM: 4 mmol/L (ref 3.5–5.1)
Sodium: 138 mmol/L (ref 135–145)
TOTAL PROTEIN: 7.8 g/dL (ref 6.5–8.1)
Total Bilirubin: 0.6 mg/dL (ref 0.3–1.2)

## 2017-05-01 LAB — URINALYSIS, ROUTINE W REFLEX MICROSCOPIC
Bilirubin Urine: NEGATIVE
Glucose, UA: NEGATIVE mg/dL
Hgb urine dipstick: NEGATIVE
Ketones, ur: NEGATIVE mg/dL
LEUKOCYTES UA: NEGATIVE
NITRITE: NEGATIVE
PROTEIN: NEGATIVE mg/dL
SPECIFIC GRAVITY, URINE: 1.009 (ref 1.005–1.030)
pH: 7 (ref 5.0–8.0)

## 2017-05-01 LAB — LIPASE, BLOOD: LIPASE: 30 U/L (ref 11–51)

## 2017-05-01 MED ORDER — MORPHINE SULFATE (PF) 2 MG/ML IV SOLN
4.0000 mg | Freq: Once | INTRAVENOUS | Status: AC
  Administered 2017-05-01: 4 mg via INTRAVENOUS
  Filled 2017-05-01: qty 2

## 2017-05-01 MED ORDER — SODIUM CHLORIDE 0.9 % IV BOLUS (SEPSIS)
1000.0000 mL | Freq: Once | INTRAVENOUS | Status: AC
  Administered 2017-05-01: 1000 mL via INTRAVENOUS

## 2017-05-01 MED ORDER — DICLOFENAC SODIUM 1 % TD GEL: TRANSDERMAL | 0 refills | Status: DC

## 2017-05-01 MED ORDER — DEXAMETHASONE SODIUM PHOSPHATE 10 MG/ML IJ SOLN
10.0000 mg | Freq: Once | INTRAMUSCULAR | Status: AC
  Administered 2017-05-01: 10 mg via INTRAVENOUS
  Filled 2017-05-01: qty 1

## 2017-05-01 MED ORDER — METHOCARBAMOL 500 MG PO TABS
500.0000 mg | ORAL_TABLET | Freq: Once | ORAL | Status: AC
  Administered 2017-05-01: 500 mg via ORAL
  Filled 2017-05-01: qty 1

## 2017-05-01 MED ORDER — METHOCARBAMOL 500 MG PO TABS: 500.0000 mg | ORAL_TABLET | Freq: Two times a day (BID) | ORAL | 0 refills | Status: DC

## 2017-05-01 MED ORDER — ACETAMINOPHEN 500 MG PO TABS: 1000.0000 mg | ORAL_TABLET | Freq: Three times a day (TID) | ORAL | 0 refills | Status: AC

## 2017-05-01 NOTE — ED Provider Notes (Signed)
[facility]-EMERGENCY DEPT Provider Note   CSN: [id number] Arrival date & time: 05/01/17  1505     History   Chief Complaint Chief Complaint  Patient presents with  . Back Pain    HPI Kelli Allison is a 51 y.o. female.  The history is provided by the patient.  Back Pain   This is a new problem. Episode onset: 3 days. The problem occurs constantly. The problem has been gradually worsening. The pain is associated with no known injury (but slept in a camper this weekend). The pain is present in the lumbar spine and sacro-iliac joint. The quality of the pain is described as aching. The pain radiates to the left thigh. The pain is severe. The symptoms are aggravated by bending, certain positions and twisting. Pertinent negatives include no numbness, no abdominal swelling, no bowel incontinence, no perianal numbness, no bladder incontinence, no paresthesias, no tingling and no weakness.   LMP 13 yrs ago due to menopause; no hysterectomy. Past Medical History:  Diagnosis Date  . Hearing loss   . Migraine   . Obesity     Patient Active Problem List   Diagnosis Date Noted  . Obesity   . Abdominal pain, epigastric 01/16/2017  . Change in bowel habits 01/16/2017  . Generalized abdominal pain 12/31/2016  . Nonintractable episodic headache 12/31/2016  . Diarrhea 12/31/2016  . Allergic rhinitis 12/31/2016    Past Surgical History:  Procedure Laterality Date  . CESAREAN SECTION    . CHOLECYSTECTOMY    . INNER EAR SURGERY    . TUBAL LIGATION      OB History    No data available       Home Medications    Prior to Admission medications   Medication Sig Start Date End Date Taking? Authorizing Provider  cyclobenzaprine (FLEXERIL) 10 MG tablet Take 1 tablet (10 mg total) by mouth 3 (three) times daily as needed for muscle spasms. 03/11/17  Yes Stacks, Broadus John, MD  glycopyrrolate (ROBINUL) 2 MG tablet Take 1 tablet (2 mg total) by mouth 2 (two) times daily as needed. 04/23/17  Yes  Iva Boop, MD  pantoprazole (PROTONIX) 40 MG tablet Take 1 tablet (40 mg total) by mouth daily before breakfast. 04/23/17  Yes Iva Boop, MD  acetaminophen (TYLENOL) 500 MG tablet Take 2 tablets (1,000 mg total) by mouth every 8 (eight) hours. Do not take more than 4000 mg of acetaminophen (Tylenol) in a 24-hour period. Please note that other medicines that you may be prescribed may have Tylenol as well. 05/01/17 05/06/17  Cardama, Amadeo Garnet, MD  butalbital-acetaminophen-caffeine (FIORICET, ESGIC) [phone] MG tablet Take 1 tablet by mouth every 6 (six) hours as needed for headache. Patient not taking: Reported on 05/01/2017 03/25/17 03/25/18  Shaune Pollack, MD  diclofenac sodium (VOLTAREN) 1 % GEL Apply 1-2 nickel size amount of Gel onto the affected area up to 3 times a day. 05/01/17   Nira Conn, MD  methocarbamol (ROBAXIN) 500 MG tablet Take 1 tablet (500 mg total) by mouth 2 (two) times daily. 05/01/17   Nira Conn, MD  ondansetron (ZOFRAN ODT) 4 MG disintegrating tablet Take 1 tablet (4 mg total) by mouth every 8 (eight) hours as needed for nausea or vomiting. Patient not taking: Reported on 05/01/2017 01/10/17   Zehr, Princella Pellegrini, PA-C  promethazine (PHENERGAN) 25 MG tablet Take 1 tablet (25 mg total) by mouth every 6 (six) hours as needed for nausea or vomiting. Patient not taking: Reported on  05/01/2017 03/25/17   Shaune Pollack, MD    Family History Family History  Problem Relation Age of Onset  . Alcohol abuse Mother   . Asthma Mother   . COPD Mother   . Depression Mother   . Drug abuse Mother   . Miscarriages / [place] Mother   . Depression Brother   . Learning disabilities Brother   . Depression Daughter   . Miscarriages / [place] Daughter   . Birth defects Son        spinabifida, scoliosis  . Kidney disease Son   . Diabetes Maternal Grandmother   . Heart disease Maternal Grandmother   . Hyperlipidemia Maternal Grandmother   .  Hypertension Maternal Grandmother   . Miscarriages / Stillbirths Maternal Grandmother   . Asthma Maternal Grandfather   . COPD Maternal Grandfather   . Cancer Paternal Grandmother   . Birth defects Brother        born with one kidney  . Depression Brother   . Learning disabilities Brother        dislexia    Social History Social History  Substance Use Topics  . Smoking status: Never Smoker  . Smokeless tobacco: Never Used  . Alcohol use Yes     Comment: occassional     Allergies   Aspirin; Caffeine; Ibuprofen; and Penicillins   Review of Systems Review of Systems  Gastrointestinal: Negative for bowel incontinence.  Genitourinary: Negative for bladder incontinence.  Musculoskeletal: Positive for back pain.  Neurological: Negative for tingling, weakness, numbness and paresthesias.  All other systems are reviewed and are negative for acute change except as noted in the HPI    Physical Exam Updated Vital Signs BP 103/65   Pulse 79   SpO2 100%   Physical Exam  Constitutional: She is oriented to person, place, and time. She appears well-developed and well-nourished. No distress.  HENT:  Head: Normocephalic and atraumatic.  Nose: Nose normal.  Eyes: Pupils are equal, round, and reactive to light. Conjunctivae and EOM are normal. Right eye exhibits no discharge. Left eye exhibits no discharge. No scleral icterus.  Neck: Normal range of motion. Neck supple.  Cardiovascular: Normal rate and regular rhythm.  Exam reveals no gallop and no friction rub.   No murmur heard. Pulmonary/Chest: Effort normal and breath sounds normal. No stridor. No respiratory distress. She has no rales.  Abdominal: Soft. She exhibits no distension. There is no tenderness. There is no rigidity, no rebound, no guarding and no CVA tenderness.  Musculoskeletal: She exhibits no edema.       Left hip: She exhibits tenderness. She exhibits normal range of motion and no bony tenderness.       Lumbar  back: She exhibits tenderness. She exhibits no bony tenderness.       Back:       Legs: Neurological: She is alert and oriented to person, place, and time.  Skin: Skin is warm and dry. No rash noted. She is not diaphoretic. No erythema.  Psychiatric: She has a normal mood and affect.  Vitals reviewed.    ED Treatments / Results  Labs (all labs ordered are listed, but only abnormal results are displayed) Labs Reviewed  URINALYSIS, ROUTINE W REFLEX MICROSCOPIC - Abnormal; Notable for the following:       Result Value   Color, Urine STRAW (*)    All other components within normal limits  CBC WITH DIFFERENTIAL/PLATELET - Abnormal; Notable for the following:    WBC 11.0 (*)  All other components within normal limits  LIPASE, BLOOD  COMPREHENSIVE METABOLIC PANEL    EKG  EKG Interpretation None       Radiology No results found.  Procedures Procedures (including critical care time) EMERGENCY DEPARTMENT US RENAL EXAM  "Study: Limited Retroperitoneal Ultrasound of Kidneys"  INDICATIONS: Flank pain and Back pain Long and short axis of both kidneys were obtained.   PERFORMED BY: Myself IMAGES ARCHIVED?: Yes LIMITATIONS: Body habitus and bowel gas VIEWS USED: Long axis and Short axis  INTERPRETATION: No Hydronephrosis, No Renal cyst, No Kidney stone    Medications Ordered in ED Medications  dexamethasone (DECADRON) injection 10 mg (not administered)  sodium chloride 0.9 % bolus 1,000 mL (0 mLs Intravenous Stopped 05/01/17 1810)  morphine 2 MG/ML injection 4 mg (4 mg Intravenous Given 05/01/17 1650)  methocarbamol (ROBAXIN) tablet 500 mg (500 mg Oral Given 05/01/17 1810)     Initial Impression / Assessment and Plan / ED Course  I have reviewed the triage vital signs and the nursing notes.  Pertinent labs & imaging results that were available during my care of the patient were reviewed by me and considered in my medical decision making (see chart for details).      presentation most concerning for muscle spasm/strain of the left lumbosacral lesion. No red flags concerning for cauda equina. Patient with a history of renal stones the UA without hematuria evidence of infection. Bedside ultrasound without evidence of hydronephrosis. Abdomen benign. Labs grossly reassuring only with mild leukocytosis. Patient with a recent colonoscopy in June without evidence of diverticulosis. Low suspicion for serious intra-abdominal inflammatory/infectious processes or small bowel obstruction. Low suspicion for septic arthritis. Patient is postmenopausal.  No indication for imaging emergently. Patient was recommended to take short course of scheduled NSAIDs and engage in early mobility as definitive treatment. Return precautions discussed for worsening or new concerning symptoms.    The patient is safe for discharge with strict return precautions.   Final Clinical Impressions(s) / ED Diagnoses   Final diagnoses:  Muscle spasm of back  Muscle strain   Disposition: Discharge  Condition: Good  I have discussed the results, Dx and Tx plan with the patient who expressed understanding and agree(s) with the plan. Discharge instructions discussed at great length. The patient was given strict return precautions who verbalized understanding of the instructions. No further questions at time of discharge.    New Prescriptions   ACETAMINOPHEN (TYLENOL) 500 MG TABLET    Take 2 tablets (1,000 mg total) by mouth every 8 (eight) hours. Do not take more than 4000 mg of acetaminophen (Tylenol) in a 24-hour period. Please note that other medicines that you may be prescribed may have Tylenol as well.   DICLOFENAC SODIUM (VOLTAREN) 1 % GEL    Apply 1-2 nickel size amount of Gel onto the affected area up to 3 times a day.   METHOCARBAMOL (ROBAXIN) 500 MG TABLET    Take 1 tablet (500 mg total) by mouth 2 (two) times daily.    Follow Up: Mechele Claude, MD [address] [place]  [address] [phone]  Schedule an appointment as soon as possible for a visit  in 1 to 2 weeks for continued pain management as needed      Cardama, Amadeo Garnet, MD 05/01/17 [phone]

## 2017-05-01 NOTE — ED Notes (Signed)
Bed: KA76 Expected date:  Expected time:  Means of arrival:  Comments: EMS 51 yo female left flank/abd pain

## 2017-05-01 NOTE — ED Triage Notes (Signed)
Pt brought in By EMS from home c/o Abdominal pain  LLQ that radiates to left side of back and occasional; to left leg. Per EMS denies N/V. Area is tender upon palpation.   BP 104/72, HR 83,  RR 18, O2 sat. 97%

## 2017-05-07 ENCOUNTER — Ambulatory Visit: Payer: 59 | Admitting: Family Medicine

## 2017-05-15 ENCOUNTER — Other Ambulatory Visit: Payer: Self-pay | Admitting: *Deleted

## 2017-05-15 ENCOUNTER — Ambulatory Visit (INDEPENDENT_AMBULATORY_CARE_PROVIDER_SITE_OTHER): Payer: 59 | Admitting: Family Medicine

## 2017-05-15 ENCOUNTER — Encounter: Payer: Self-pay | Admitting: Family Medicine

## 2017-05-15 VITALS — BP 111/65 | HR 65 | Temp 97.1°F | Ht 61.0 in | Wt 203.0 lb

## 2017-05-15 DIAGNOSIS — E039 Hypothyroidism, unspecified: Secondary | ICD-10-CM | POA: Diagnosis not present

## 2017-05-15 MED ORDER — LEVOTHYROXINE SODIUM 50 MCG PO TABS: 50.0000 ug | ORAL_TABLET | Freq: Every day | ORAL | 2 refills | Status: DC

## 2017-05-15 NOTE — Progress Notes (Signed)
Subjective:  Patient ID: Kelli Allison, female    DOB: [birth year]  Age: 51 y.o. MRN: [id number]  CC: Hospitalization Follow-up (pt here today for follow up after going to the ED and was told she pulled a muscle in her hip and back)   HPI Kelli Allison presents for Patient presents for follow-up on  thyroid. The patient had two recent TSH levels elevated. She was told in the past she gained weight because she had no metabolism. Pt. denies any change in  voice, loss of hair, heat or cold intolerance. Energy level has been adequate.. Patient denies constipation and diarrhea. No myxedema. Interested in treatment due to weight gain  Depression screen [facility] 2/9 05/15/2017 03/11/2017 02/11/2017  Decreased Interest 0 0 0  Down, Depressed, Hopeless 0 0 0  PHQ - 2 Score 0 0 0  Altered sleeping - - -  Tired, decreased energy - - -  Change in appetite - - -  Feeling bad or failure about yourself  - - -  Trouble concentrating - - -  Moving slowly or fidgety/restless - - -  Suicidal thoughts - - -  PHQ-9 Score - - -    History Kelli Allison has a past medical history of Hearing loss; Migraine; and Obesity.   She has a past surgical history that includes Inner ear surgery; Cesarean section; Cholecystectomy; and Tubal ligation.   Her family history includes Alcohol abuse in her mother; Asthma in her maternal grandfather and mother; Birth defects in her brother and son; COPD in her maternal grandfather and mother; Cancer in her paternal grandmother; Depression in her brother, brother, daughter, and mother; Diabetes in her maternal grandmother; Drug abuse in her mother; Heart disease in her maternal grandmother; Hyperlipidemia in her maternal grandmother; Hypertension in her maternal grandmother; Kidney disease in her son; Learning disabilities in her brother and brother; Miscarriages / [place] in her daughter, maternal grandmother, and mother.She reports that she has never smoked. She has never used smokeless  tobacco. She reports that she drinks alcohol. She reports that she does not use drugs.    ROS Review of Systems  Constitutional: Negative for activity change, appetite change and fever.  HENT: Negative for congestion, rhinorrhea and sore throat.   Eyes: Negative for visual disturbance.  Respiratory: Negative for cough and shortness of breath. Apnea:     Cardiovascular: Negative for chest pain and palpitations.  Gastrointestinal: Negative for abdominal pain, diarrhea and nausea.  Genitourinary: Negative for dysuria.  Musculoskeletal: Positive for back pain (much improved). Negative for arthralgias and myalgias.    Objective:  BP 111/65   Pulse 65   Temp (!) 97.1 F (36.2 C) (Oral)   Ht '5\' 1"'$  (1.549 m)   Wt 203 lb (92.1 kg)   BMI 38.36 kg/m   BP Readings from Last 3 Encounters:  05/15/17 111/65  05/01/17 103/65  04/23/17 100/70    Wt Readings from Last 3 Encounters:  05/15/17 203 lb (92.1 kg)  04/23/17 200 lb (90.7 kg)  03/25/17 192 lb (87.1 kg)     Physical Exam    Assessment & Plan:   Kelli Allison was seen today for hospitalization follow-up.  Diagnoses and all orders for this visit:  Hypothyroidism, unspecified type -     CBC with Differential/Platelet -     CMP14+EGFR -     T4, Free  Other orders -     levothyroxine (SYNTHROID, LEVOTHROID) 50 MCG tablet; Take 1 tablet (50 mcg total) by mouth daily before  breakfast.       I am having Kelli Allison start on levothyroxine. I am also having her maintain her ondansetron, cyclobenzaprine, butalbital-acetaminophen-caffeine, promethazine, pantoprazole, glycopyrrolate, diclofenac sodium, and methocarbamol.  Allergies as of 05/15/2017      Reactions   Aspirin Nausea Only   Caffeine Other (See Comments)   Or the color in sodas-causes GI problems   Ibuprofen    Stomach ulcers   Penicillins Hives   Has patient had a PCN reaction causing immediate rash, facial/tongue/throat swelling, SOB or lightheadedness with  hypotension: YES Has patient had a PCN reaction causing severe rash involving mucus membranes or skin necrosis: NO Has patient had a PCN reaction that required hospitalization NO Has patient had a PCN reaction occurring within the last 10 years:NO If all of the above answers are "NO", then may proceed with Cephalosporin use.      Medication List       Accurate as of 05/15/17  5:07 PM. Always use your most recent med list.          butalbital-acetaminophen-caffeine 50-325-40 MG tablet Commonly known as:  FIORICET, ESGIC Take 1 tablet by mouth every 6 (six) hours as needed for headache.   cyclobenzaprine 10 MG tablet Commonly known as:  FLEXERIL Take 1 tablet (10 mg total) by mouth 3 (three) times daily as needed for muscle spasms.   diclofenac sodium 1 % Gel Commonly known as:  VOLTAREN Apply 1-2 nickel size amount of Gel onto the affected area up to 3 times a day.   glycopyrrolate 2 MG tablet Commonly known as:  ROBINUL Take 1 tablet (2 mg total) by mouth 2 (two) times daily as needed.   levothyroxine 50 MCG tablet Commonly known as:  SYNTHROID, LEVOTHROID Take 1 tablet (50 mcg total) by mouth daily before breakfast.   methocarbamol 500 MG tablet Commonly known as:  ROBAXIN Take 1 tablet (500 mg total) by mouth 2 (two) times daily.   ondansetron 4 MG disintegrating tablet Commonly known as:  ZOFRAN ODT Take 1 tablet (4 mg total) by mouth every 8 (eight) hours as needed for nausea or vomiting.   pantoprazole 40 MG tablet Commonly known as:  PROTONIX Take 1 tablet (40 mg total) by mouth daily before breakfast.   promethazine 25 MG tablet Commonly known as:  PHENERGAN Take 1 tablet (25 mg total) by mouth every 6 (six) hours as needed for nausea or vomiting.            Discharge Care Instructions        Start     Ordered   05/15/17 0000  CBC with Differential/Platelet     05/15/17 1655   05/15/17 0000  CMP14+EGFR     05/15/17 1655   05/15/17 0000  T4, Free      05/15/17 1655   05/15/17 0000  levothyroxine (SYNTHROID, LEVOTHROID) 50 MCG tablet  Daily before breakfast     05/15/17 1655       Follow-up: Return in about 6 weeks (around 06/26/2017).  Claretta Fraise, M.D.

## 2017-05-15 NOTE — Patient Instructions (Signed)
Hypothyroidism Hypothyroidism is a disorder of the thyroid. The thyroid is a large gland that is located in the lower front of the neck. The thyroid releases hormones that control how the body works. With hypothyroidism, the thyroid does not make enough of these hormones. What are the causes? Causes of hypothyroidism may include:  Viral infections.  Pregnancy.  Your own defense system (immune system) attacking your thyroid.  Certain medicines.  Birth defects.  Past radiation treatments to your head or neck.  Past treatment with radioactive iodine.  Past surgical removal of part or all of your thyroid.  Problems with the gland that is located in the center of your brain (pituitary).  What are the signs or symptoms? Signs and symptoms of hypothyroidism may include:  Feeling as though you have no energy (lethargy).  Inability to tolerate cold.  Weight gain that is not explained by a change in diet or exercise habits.  Dry skin.  Coarse hair.  Menstrual irregularity.  Slowing of thought processes.  Constipation.  Sadness or depression.  How is this diagnosed? Your health care provider may diagnose hypothyroidism with blood tests and ultrasound tests. How is this treated? Hypothyroidism is treated with medicine that replaces the hormones that your body does not make. After you begin treatment, it may take several weeks for symptoms to go away. Follow these instructions at home:  Take medicines only as directed by your health care provider.  If you start taking any new medicines, tell your health care provider.  Keep all follow-up visits as directed by your health care provider. This is important. As your condition improves, your dosage needs may change. You will need to have blood tests regularly so that your health care provider can watch your condition. Contact a health care provider if:  Your symptoms do not get better with treatment.  You are taking thyroid  replacement medicine and: ? You sweat excessively. ? You have tremors. ? You feel anxious. ? You lose weight rapidly. ? You cannot tolerate heat. ? You have emotional swings. ? You have diarrhea. ? You feel weak. Get help right away if:  You develop chest pain.  You develop an irregular heartbeat.  You develop a rapid heartbeat. This information is not intended to replace advice given to you by your health care provider. Make sure you discuss any questions you have with your health care provider. Document Released: 08/27/2005 Document Revised: 02/02/2016 Document Reviewed: 01/12/2014 Elsevier Interactive Patient Education  2017 [employer]vier Inc.  

## 2017-05-16 ENCOUNTER — Other Ambulatory Visit: Payer: Self-pay | Admitting: Family Medicine

## 2017-05-16 ENCOUNTER — Telehealth: Payer: Self-pay | Admitting: Family Medicine

## 2017-05-16 LAB — CBC WITH DIFFERENTIAL/PLATELET
Basophils Absolute: 0 10*3/uL (ref 0.0–0.2)
Basos: 0 %
EOS (ABSOLUTE): 0.2 10*3/uL (ref 0.0–0.4)
EOS: 2 %
HEMATOCRIT: 40.3 % (ref 34.0–46.6)
Hemoglobin: 13.8 g/dL (ref 11.1–15.9)
Immature Grans (Abs): 0 10*3/uL (ref 0.0–0.1)
Immature Granulocytes: 0 %
LYMPHS ABS: 2.7 10*3/uL (ref 0.7–3.1)
Lymphs: 32 %
MCH: 30 pg (ref 26.6–33.0)
MCHC: 34.2 g/dL (ref 31.5–35.7)
MCV: 88 fL (ref 79–97)
MONOS ABS: 0.7 10*3/uL (ref 0.1–0.9)
Monocytes: 8 %
Neutrophils Absolute: 5 10*3/uL (ref 1.4–7.0)
Neutrophils: 58 %
Platelets: 226 10*3/uL (ref 150–379)
RBC: 4.6 x10E6/uL (ref 3.77–5.28)
RDW: 15.1 % (ref 12.3–15.4)
WBC: 8.5 10*3/uL (ref 3.4–10.8)

## 2017-05-16 LAB — CMP14+EGFR
A/G RATIO: 1.9 (ref 1.2–2.2)
ALT: 10 IU/L (ref 0–32)
AST: 14 IU/L (ref 0–40)
Albumin: 4.3 g/dL (ref 3.5–5.5)
Alkaline Phosphatase: 87 IU/L (ref 39–117)
BUN/Creatinine Ratio: 13 (ref 9–23)
BUN: 11 mg/dL (ref 6–24)
Bilirubin Total: <0.2 mg/dL (ref 0.0–1.2)
CHLORIDE: 100 mmol/L (ref 96–106)
CO2: 24 mmol/L (ref 20–29)
CREATININE: 0.84 mg/dL (ref 0.57–1.00)
Calcium: 9.4 mg/dL (ref 8.7–10.2)
GFR calc Af Amer: 94 mL/min/{1.73_m2} (ref >59)
GFR calc non Af Amer: 82 mL/min/{1.73_m2} (ref >59)
GLOBULIN, TOTAL: 2.3 g/dL (ref 1.5–4.5)
Glucose: 90 mg/dL (ref 65–99)
POTASSIUM: 4.3 mmol/L (ref 3.5–5.2)
SODIUM: 141 mmol/L (ref 134–144)
Total Protein: 6.6 g/dL (ref 6.0–8.5)

## 2017-05-16 LAB — T4, FREE: Free T4: 0.54 ng/dL — ABNORMAL LOW (ref 0.82–1.77)

## 2017-05-16 MED ORDER — LEVOTHYROXINE SODIUM 50 MCG PO TABS: 50.0000 ug | ORAL_TABLET | Freq: Every day | ORAL | 2 refills | Status: DC

## 2017-05-16 MED ORDER — LEVOTHYROXINE SODIUM 75 MCG PO TABS: 75.0000 ug | ORAL_TABLET | Freq: Every day | ORAL | 1 refills | Status: DC

## 2017-05-16 NOTE — Telephone Encounter (Signed)
Pt will contact pharmacy to transfer

## 2017-06-04 ENCOUNTER — Ambulatory Visit (INDEPENDENT_AMBULATORY_CARE_PROVIDER_SITE_OTHER): Payer: 59 | Admitting: Family Medicine

## 2017-06-04 ENCOUNTER — Encounter: Payer: Self-pay | Admitting: Family Medicine

## 2017-06-04 VITALS — BP 99/69 | HR 85 | Temp 97.8°F | Ht 61.0 in | Wt 201.0 lb

## 2017-06-04 DIAGNOSIS — Z91038 Other insect allergy status: Secondary | ICD-10-CM | POA: Diagnosis not present

## 2017-06-04 MED ORDER — BETAMETHASONE SOD PHOS & ACET 6 (3-3) MG/ML IJ SUSP
6.0000 mg | Freq: Once | INTRAMUSCULAR | Status: AC
  Administered 2017-06-04: 6 mg via INTRAMUSCULAR

## 2017-06-04 NOTE — Progress Notes (Signed)
Chief Complaint  Patient presents with  . Rash    pt here today c/o rash on right forearm that is very itchy    HPI  Patient presents today for Lesion on forearm that came up overnight. The itching is all over. There is localized edema that is under the skin at the right forearm. Patient is very concerned about poisoning from a spider or Lyme's disease from a tic.  PMH: Smoking status noted ROS: Per HPI  Objective: BP 99/69   Pulse 85   Temp 97.8 F (36.6 C) (Oral)   Ht  (1.549 m)   Wt 201 lb (91.2 kg)   BMI 37.98 kg/m  Gen: NAD, alert, cooperative with exam HEENT: NCAT, EOMI, PERRL Resp: CTABL, no wheezes, non-labored Ext: 3 x 5 cm area of localized edema/urticaria noted at the ulnar aspect of the forearm about 6-8 cm above the ulnar styloid. Neuro: Alert and oriented, No gross deficits  Assessment and plan:  1. Allergic reaction to insect bite     Meds ordered this encounter  Medications  . betamethasone acetate-betamethasone sodium phosphate (CELESTONE) injection 6 mg    Follow up as needed.  Mechele Claude, MD

## 2017-06-12 DIAGNOSIS — M549 Dorsalgia, unspecified: Secondary | ICD-10-CM | POA: Diagnosis not present

## 2017-06-12 DIAGNOSIS — N200 Calculus of kidney: Secondary | ICD-10-CM | POA: Diagnosis not present

## 2017-06-12 DIAGNOSIS — Z87442 Personal history of urinary calculi: Secondary | ICD-10-CM | POA: Diagnosis not present

## 2017-06-12 DIAGNOSIS — R109 Unspecified abdominal pain: Secondary | ICD-10-CM | POA: Diagnosis not present

## 2017-06-28 ENCOUNTER — Ambulatory Visit: Payer: 59 | Admitting: Family Medicine

## 2017-07-16 IMAGING — MR MR HEAD W/O CM
10 of 12 series · 32 of 48 positions shown · non-contrast
Comparison: CT studies same day.  MRI 02/13/2012

CLINICAL DATA: Severe headache and left-sided visual disturbance.

EXAM:
MRI HEAD WITHOUT CONTRAST
TECHNIQUE: Multiplanar, multiecho pulse sequences of the brain and surrounding
structures were obtained without intravenous contrast.

[Series 3: DWI · axial · 3.0mm · 0.94mm/px · z∈[-57,+87]mm · 7 of 100 slices shown (1 of 2)]
[im 1/100]
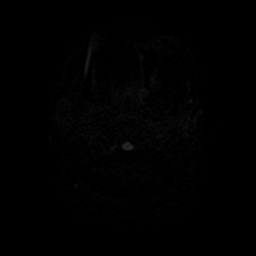
[im 17/100]
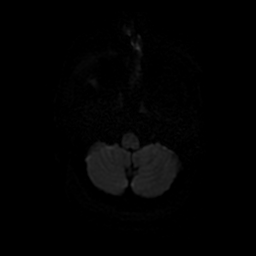
[im 34/100]
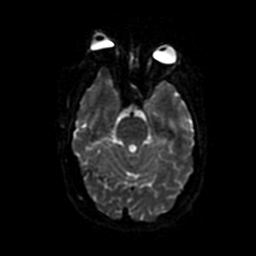
[im 50/100]
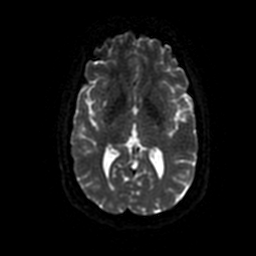
[im 67/100]
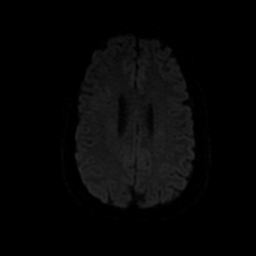
[im 83/100]
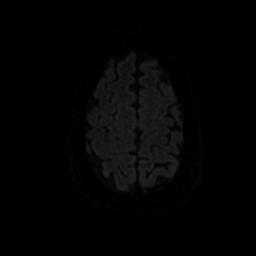
[im 100/100]
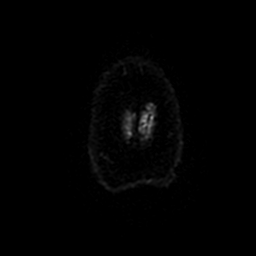

[Series 4: FLAIR · sagittal · 5.0mm · 0.47mm/px · 2 of 23 slices shown (1 of 2)]
[im 1/23]
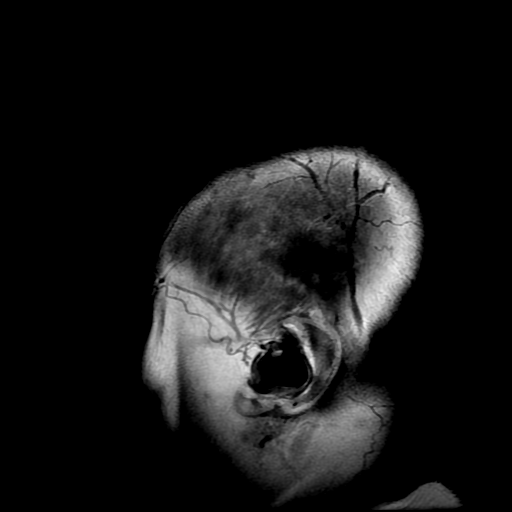
[im 23/23]
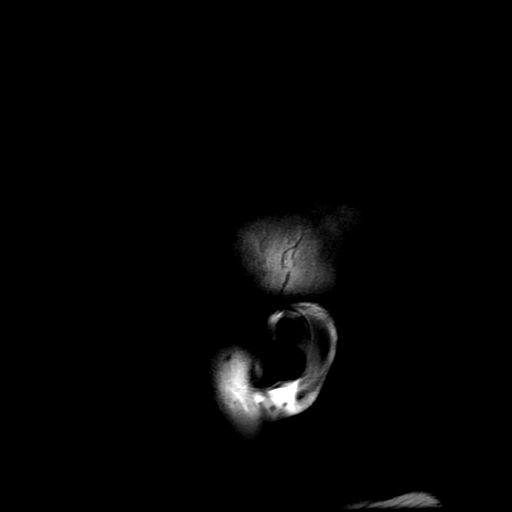

[Series 6: T2 · axial · 5.0mm · 0.47mm/px · z∈[-50,+97]mm · 2 of 26 slices shown (1 of 2)]
[im 1/26]
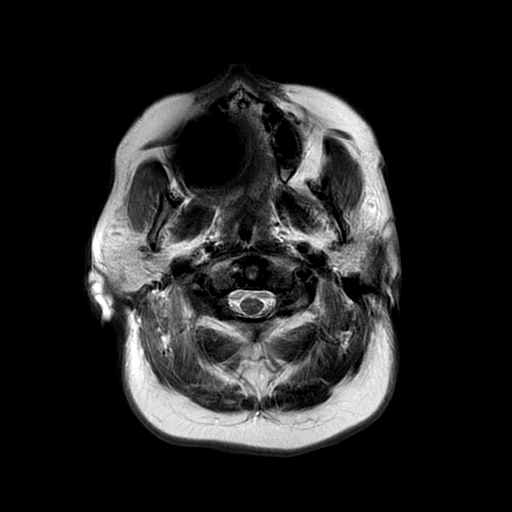
[im 26/26]
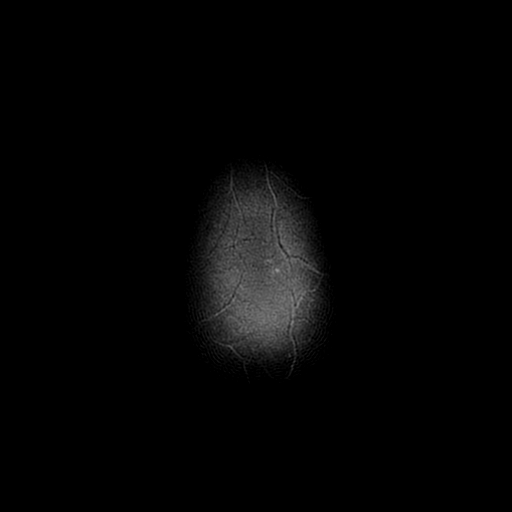

[Series 7: FLAIR · axial · 5.0mm · 0.47mm/px · z∈[-50,+97]mm · 2 of 26 slices shown (2 of 2)]
[im 1/26]
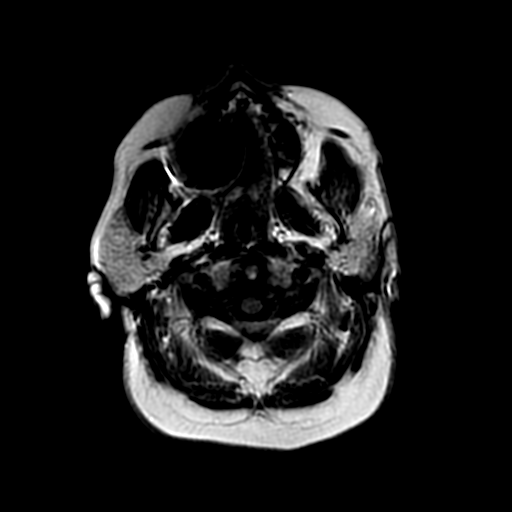
[im 26/26]
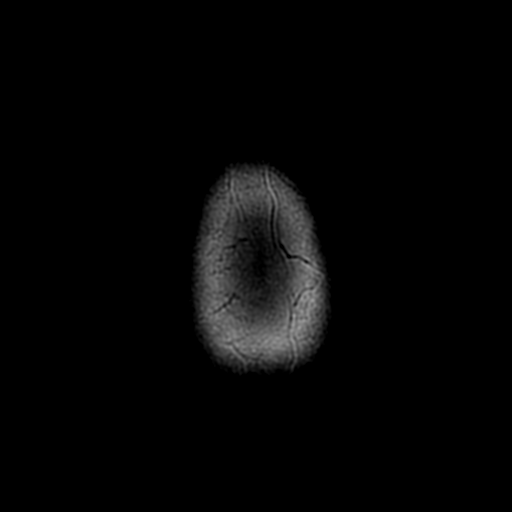

[Series 8: DWI · coronal · 4.0mm · 0.94mm/px · 4 of 68 slices shown (2 of 2)]
[im 1/68]
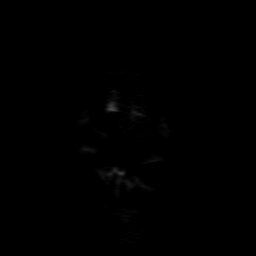
[im 23/68]
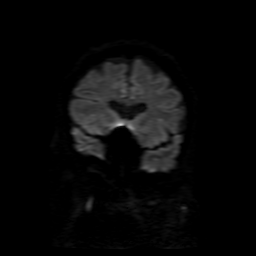
[im 45/68]
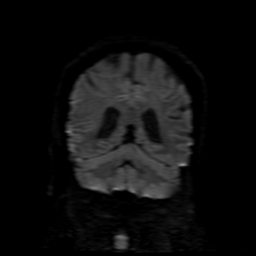
[im 68/68]
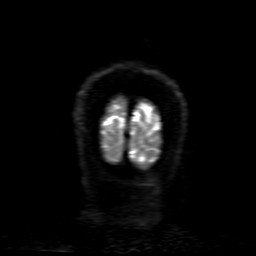

[Series 9: (person_name) · axial · 3.0mm · 0.47mm/px · z∈[-51,+95]mm · 6 of 100 slices shown]
[im 1/100]
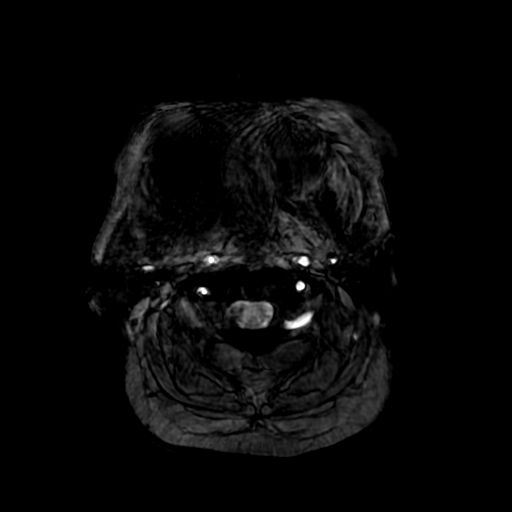
[im 20/100]
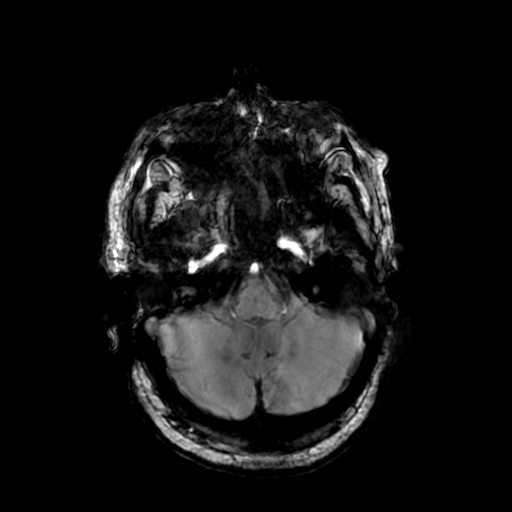
[im 40/100]
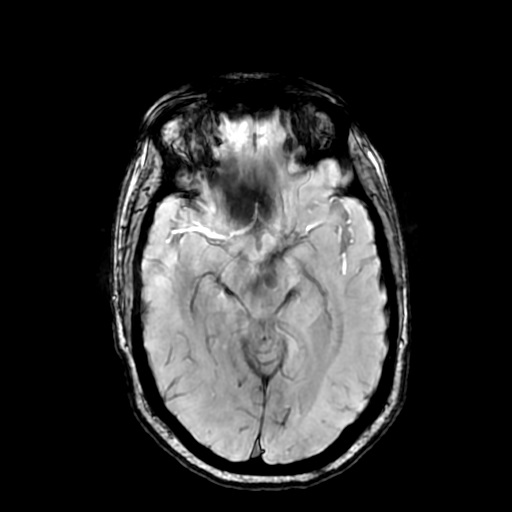
[im 60/100]
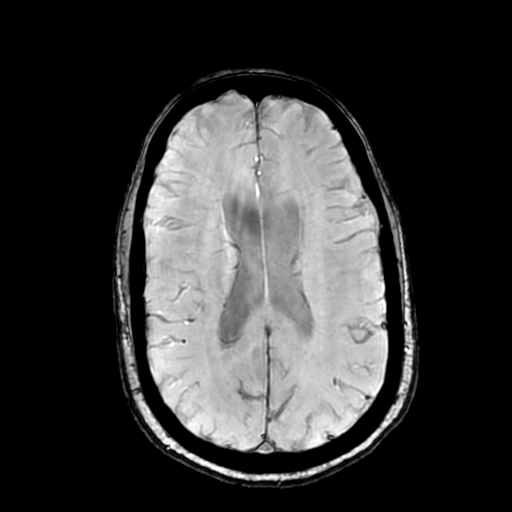
[im 80/100]
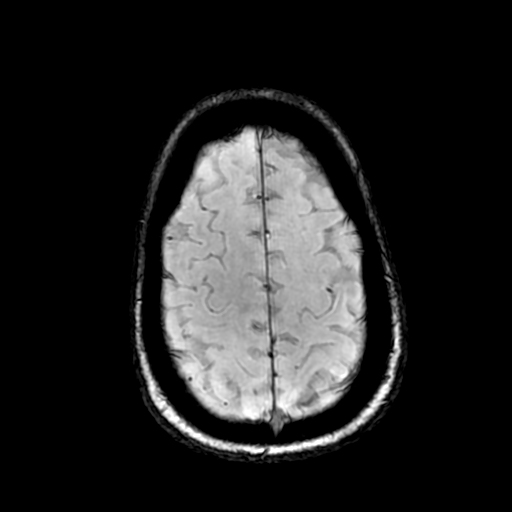
[im 100/100]
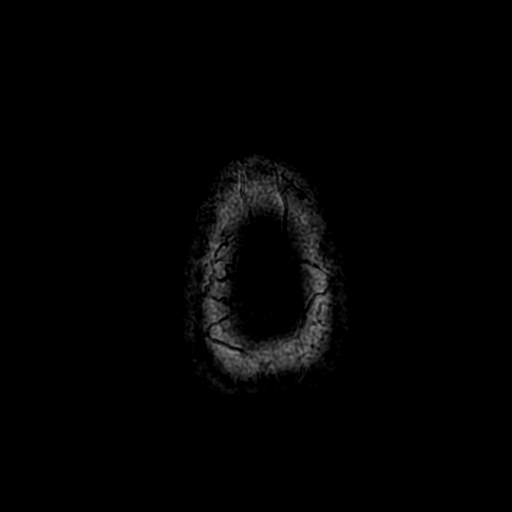

[Series 10: ax 3(person_name) · axial · 3.0mm · 0.94mm/px · z∈[-55,-1]mm · 2 of 56 slices shown]
[im 1/56]
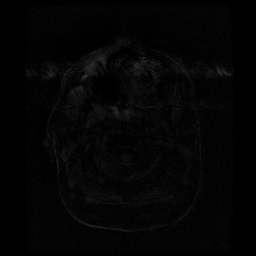
[im 19/56]
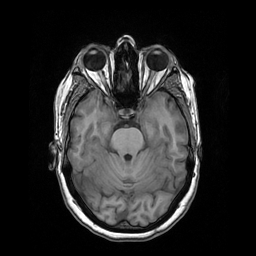

[Series 11: T2 · coronal · 5.0mm · 0.47mm/px · 2 of 29 slices shown (2 of 2)]
[im 1/29]
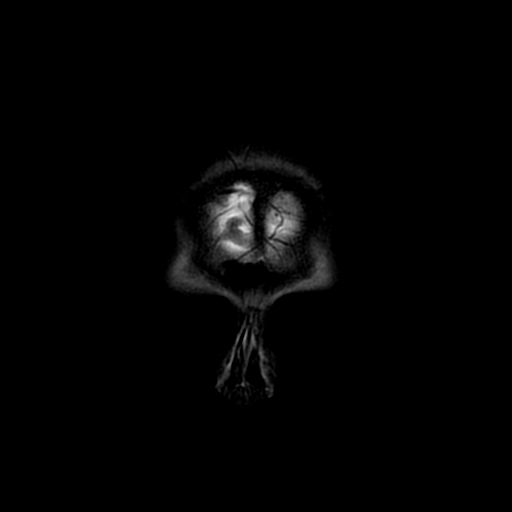
[im 29/29]
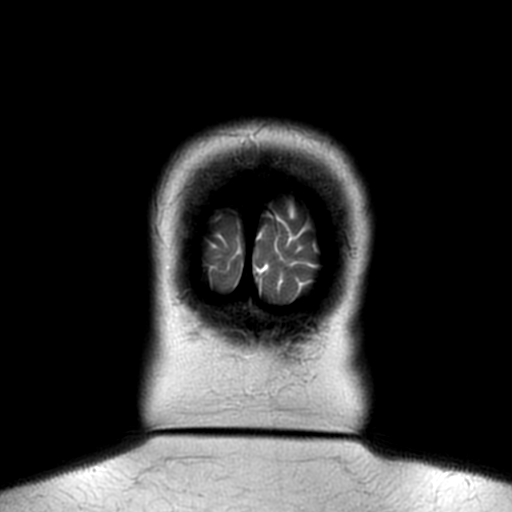

[Series 350: ADC · axial · 3.0mm · 0.94mm/px · z∈[-57,+87]mm · 3 of 50 slices shown (1 of 2)]
[im 1/50]
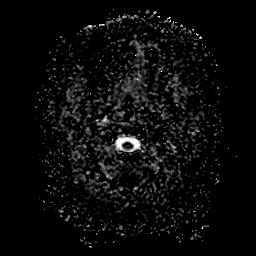
[im 25/50]
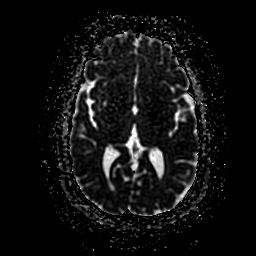
[im 50/50]
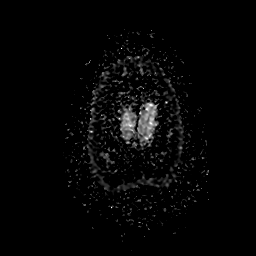

[Series 850: ADC · coronal · 4.0mm · 0.94mm/px · 2 of 34 slices shown (2 of 2)]
[im 1/34]
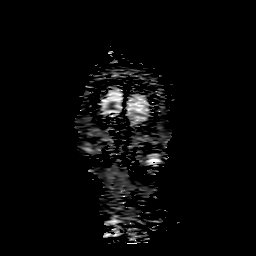
[im 34/34]
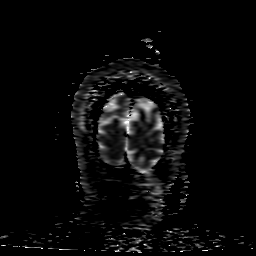

[32 of 48 positions shown; findings below may reference images not displayed]

FINDINGS: Brain: Diffusion imaging does not show any acute or subacute
infarction. The brainstem and cerebellum are normal. Cerebral
hemispheres show a few punctate foci of T2 and FLAIR signal in the
frontal white matter not of any clinical significance. No cortical
or large vessel territory insult. No mass lesion, hemorrhage,
hydrocephalus or extra-axial collection.

Vascular: Major vessels at the base of the brain show flow.

Skull and upper cervical spine: Negative

Sinuses/Orbits: Sinuses are clear. Orbits negative. Some fluid in
the right mastoid region.

Other: None significant
IMPRESSION: Normal appearance of the brain. No intracranial cause of headache or
visual changes identified.

## 2017-07-24 DIAGNOSIS — M9901 Segmental and somatic dysfunction of cervical region: Secondary | ICD-10-CM | POA: Diagnosis not present

## 2017-07-24 DIAGNOSIS — M5412 Radiculopathy, cervical region: Secondary | ICD-10-CM | POA: Diagnosis not present

## 2017-07-24 DIAGNOSIS — M436 Torticollis: Secondary | ICD-10-CM | POA: Diagnosis not present

## 2017-08-07 ENCOUNTER — Emergency Department (HOSPITAL_BASED_OUTPATIENT_CLINIC_OR_DEPARTMENT_OTHER): Payer: 59

## 2017-08-07 ENCOUNTER — Emergency Department (HOSPITAL_BASED_OUTPATIENT_CLINIC_OR_DEPARTMENT_OTHER)
Admission: EM | Admit: 2017-08-07 | Discharge: 2017-08-07 | Disposition: A | Discharge status: Home / Self Care | Payer: 59 | Attending: Emergency Medicine | Admitting: Emergency Medicine

## 2017-08-07 ENCOUNTER — Other Ambulatory Visit: Payer: Self-pay

## 2017-08-07 ENCOUNTER — Encounter (HOSPITAL_BASED_OUTPATIENT_CLINIC_OR_DEPARTMENT_OTHER): Payer: Self-pay | Admitting: *Deleted

## 2017-08-07 DIAGNOSIS — E039 Hypothyroidism, unspecified: Secondary | ICD-10-CM | POA: Diagnosis not present

## 2017-08-07 DIAGNOSIS — Z87442 Personal history of urinary calculi: Secondary | ICD-10-CM | POA: Diagnosis not present

## 2017-08-07 DIAGNOSIS — R1030 Lower abdominal pain, unspecified: Secondary | ICD-10-CM | POA: Diagnosis not present

## 2017-08-07 DIAGNOSIS — M545 Low back pain, unspecified: Secondary | ICD-10-CM

## 2017-08-07 DIAGNOSIS — Z880 Allergy status to penicillin: Secondary | ICD-10-CM | POA: Diagnosis not present

## 2017-08-07 DIAGNOSIS — Z79891 Long term (current) use of opiate analgesic: Secondary | ICD-10-CM | POA: Insufficient documentation

## 2017-08-07 DIAGNOSIS — Z79899 Other long term (current) drug therapy: Secondary | ICD-10-CM | POA: Insufficient documentation

## 2017-08-07 DIAGNOSIS — R109 Unspecified abdominal pain: Secondary | ICD-10-CM | POA: Diagnosis not present

## 2017-08-07 DIAGNOSIS — R300 Dysuria: Secondary | ICD-10-CM | POA: Diagnosis not present

## 2017-08-07 HISTORY — DX: Gastric ulcer, unspecified as acute or chronic, without hemorrhage or perforation: T39.395A

## 2017-08-07 HISTORY — DX: Hypothyroidism, unspecified: E03.9

## 2017-08-07 HISTORY — DX: Calculus of kidney: N20.0

## 2017-08-07 HISTORY — DX: Gastric ulcer, unspecified as acute or chronic, without hemorrhage or perforation: K25.9

## 2017-08-07 HISTORY — DX: Irritable bowel syndrome, unspecified: K58.9

## 2017-08-07 LAB — COMPREHENSIVE METABOLIC PANEL
ALT: 28 U/L (ref 14–54)
AST: 28 U/L (ref 15–41)
Albumin: 3.9 g/dL (ref 3.5–5.0)
Alkaline Phosphatase: 77 U/L (ref 38–126)
Anion gap: 7 (ref 5–15)
BUN: 13 mg/dL (ref 6–20)
CALCIUM: 8.9 mg/dL (ref 8.9–10.3)
CHLORIDE: 105 mmol/L (ref 101–111)
CO2: 26 mmol/L (ref 22–32)
CREATININE: 1.05 mg/dL — AB (ref 0.44–1.00)
Glucose, Bld: 90 mg/dL (ref 65–99)
Potassium: 3.7 mmol/L (ref 3.5–5.1)
SODIUM: 138 mmol/L (ref 135–145)
Total Bilirubin: 0.4 mg/dL (ref 0.3–1.2)
Total Protein: 7.1 g/dL (ref 6.5–8.1)

## 2017-08-07 LAB — CBC WITH DIFFERENTIAL/PLATELET
Basophils Absolute: 0 10*3/uL (ref 0.0–0.1)
Basophils Relative: 0 %
Eosinophils Absolute: 0.1 10*3/uL (ref 0.0–0.7)
Eosinophils Relative: 1 %
HEMATOCRIT: 40.4 % (ref 36.0–46.0)
Hemoglobin: 13.2 g/dL (ref 12.0–15.0)
LYMPHS PCT: 30 %
Lymphs Abs: 2 10*3/uL (ref 0.7–4.0)
MCH: 29.6 pg (ref 26.0–34.0)
MCHC: 32.7 g/dL (ref 30.0–36.0)
MCV: 90.6 fL (ref 78.0–100.0)
MONO ABS: 0.5 10*3/uL (ref 0.1–1.0)
Monocytes Relative: 7 %
NEUTROS ABS: 4.2 10*3/uL (ref 1.7–7.7)
Neutrophils Relative %: 62 %
Platelets: 240 10*3/uL (ref 150–400)
RBC: 4.46 MIL/uL (ref 3.87–5.11)
RDW: 14.3 % (ref 11.5–15.5)
WBC: 6.9 10*3/uL (ref 4.0–10.5)

## 2017-08-07 LAB — URINALYSIS, ROUTINE W REFLEX MICROSCOPIC
Bilirubin Urine: NEGATIVE
GLUCOSE, UA: NEGATIVE mg/dL
HGB URINE DIPSTICK: NEGATIVE
KETONES UR: NEGATIVE mg/dL
Leukocytes, UA: NEGATIVE
Nitrite: NEGATIVE
PROTEIN: NEGATIVE mg/dL
Specific Gravity, Urine: <1.005 — ABNORMAL LOW (ref 1.005–1.030)
pH: 6 (ref 5.0–8.0)

## 2017-08-07 MED ORDER — SODIUM CHLORIDE 0.9 % IV BOLUS (SEPSIS)
1000.0000 mL | Freq: Once | INTRAVENOUS | Status: AC
  Administered 2017-08-07: 1000 mL via INTRAVENOUS

## 2017-08-07 MED ORDER — MORPHINE SULFATE (PF) 4 MG/ML IV SOLN
4.0000 mg | Freq: Once | INTRAVENOUS | Status: AC
  Administered 2017-08-07: 4 mg via INTRAVENOUS
  Filled 2017-08-07: qty 1

## 2017-08-07 MED ORDER — METHOCARBAMOL 500 MG PO TABS: 500.0000 mg | ORAL_TABLET | Freq: Two times a day (BID) | ORAL | 0 refills | Status: DC

## 2017-08-07 MED ORDER — ONDANSETRON HCL 4 MG/2ML IJ SOLN
4.0000 mg | Freq: Once | INTRAMUSCULAR | Status: AC
  Administered 2017-08-07: 4 mg via INTRAVENOUS
  Filled 2017-08-07: qty 2

## 2017-08-07 MED ORDER — HYDROCODONE-ACETAMINOPHEN 5-325 MG PO TABS: 1.0000 | ORAL_TABLET | ORAL | 0 refills | Status: DC | PRN

## 2017-08-07 MED ORDER — ONDANSETRON 4 MG PO TBDP: 4.0000 mg | ORAL_TABLET | Freq: Three times a day (TID) | ORAL | 0 refills | Status: DC | PRN

## 2017-08-07 MED ORDER — KETOROLAC TROMETHAMINE 30 MG/ML IJ SOLN
30.0000 mg | Freq: Once | INTRAMUSCULAR | Status: AC
  Administered 2017-08-07: 30 mg via INTRAVENOUS
  Filled 2017-08-07: qty 1

## 2017-08-07 MED ORDER — LIDOCAINE 5 % EX PTCH: 1.0000 | MEDICATED_PATCH | CUTANEOUS | 0 refills | Status: DC

## 2017-08-07 MED FILL — METHOCARBAMOL 500 MG TABLET: 500 | 10 days supply | Qty: 20 | Fill #0

## 2017-08-07 MED FILL — ONDANSETRON ODT 4 MG TABLET: 4 | 6 days supply | Qty: 20 | Fill #0

## 2017-08-07 MED FILL — HYDROCODON-APAP 5-325: 5-325 | 2 days supply | Qty: 15 | Fill #0

## 2017-08-07 NOTE — Discharge Instructions (Signed)
Take it easy, but do not lay around too much as this may make any stiffness worse.  The source of your pain could still be from a kidney stone or it may be muscular in nature. Tylenol: May take tylenol for pain. Your daily total maximum amount of tylenol from all sources should be limited to 4000mg /day for persons without liver problems, or 2000mg /day for those with liver problems. Vicodin: May take Vicodin as needed for severe pain.  Do not drive or perform other dangerous activities while taking the Vicodin.  Please note that each pill of Vicodin contains 325 mg of Tylenol and the above dosage limits apply. Zofran: May take the Zofran as needed for nausea/vomiting. Muscle relaxer: Robaxin is a muscle relaxer and may help loosen stiff muscles. Do not take the Robaxin while driving or performing other dangerous activities.  Lidocaine patches: These are available via either prescription or over-the-counter. The over-the-counter option may be more economical one and are likely just as effective. There are multiple over-the-counter brands, such as Salonpas. Exercises: Be sure to perform the attached exercises starting with three times a week and working up to performing them daily. This is an essential part of preventing long term problems.   Follow up with a primary care provider for any future management of these complaints.

## 2017-08-07 NOTE — ED Notes (Signed)
ED Provider at bedside. 

## 2017-08-07 NOTE — ED Triage Notes (Signed)
Pt reports mid back pain since last night. States her feet are cold. Denies injury, reports bladder pressure

## 2017-08-07 NOTE — ED Notes (Signed)
Patient aware urine specimen needed.  States unable to provide at present.

## 2017-08-07 NOTE — ED Provider Notes (Signed)
[facility] EMERGENCY DEPARTMENT Provider Note   CSN: [id number] Arrival date & time: 08/07/17  1051     History   Chief Complaint Chief Complaint  Patient presents with  . Back Pain    HPI Kelli Allison is a 51 y.o. female.  HPI  Kelli Allison is a 51 y.o. female, with a history of kidney stones, migraine, gastric ulcer due to NSAIDs, IBS, and hypothyroidism, presenting to the ED with right lower back beginning last night. Sharp, "feels like someone is stabbing me in the back," 9/10, nonradiating, waxes and wanes.  States it feels like a kidney stone.  Accompanied by suprapubic pressure, difficulty urinating, nausea, and one episode of emesis this morning. Took tylenol with last dose 9am this morning.   Denies diarrhea, fever/chills, abdominal pain, neuro deficits, abnormal vaginal discharge, vaginal bleeding, dysuria, hematuria, or any other complaints.  Past Medical History:  Diagnosis Date  . Hearing loss   . Hypothyroidism   . IBS (irritable bowel syndrome)   . Kidney stones   . Migraine   . Obesity   . Stomach ulcer due to nonsteroidal anti-inflammatory drug (NSAID)     Patient Active Problem List   Diagnosis Date Noted  . Obesity   . Abdominal pain, epigastric 01/16/2017  . Change in bowel habits 01/16/2017  . Generalized abdominal pain 12/31/2016  . Nonintractable episodic headache 12/31/2016  . Diarrhea 12/31/2016  . Allergic rhinitis 12/31/2016    Past Surgical History:  Procedure Laterality Date  . CESAREAN SECTION    . CHOLECYSTECTOMY    . INNER EAR SURGERY    . TUBAL LIGATION      OB History    No data available       Home Medications    Prior to Admission medications   Medication Sig Start Date End Date Taking? Authorizing Provider  diclofenac sodium (VOLTAREN) 1 % GEL Apply 1-2 nickel size amount of Gel onto the affected area up to 3 times a day. 05/01/17  Yes Cardama, Amadeo GarnetPedro Eduardo, MD  glycopyrrolate (ROBINUL) 2 MG  tablet Take 1 tablet (2 mg total) by mouth 2 (two) times daily as needed. 04/23/17  Yes Iva BoopGessner, Carl E, MD  levothyroxine (SYNTHROID, LEVOTHROID) 75 MCG tablet Take 1 tablet (75 mcg total) by mouth daily before breakfast. 05/16/17  Yes Mechele ClaudeStacks, Warren, MD  ondansetron (ZOFRAN ODT) 4 MG disintegrating tablet Take 1 tablet (4 mg total) by mouth every 8 (eight) hours as needed for nausea or vomiting. 01/10/17  Yes Zehr, Princella PellegriniJessica D, PA-C  pantoprazole (PROTONIX) 40 MG tablet Take 1 tablet (40 mg total) by mouth daily before breakfast. 04/23/17  Yes Iva BoopGessner, Carl E, MD  butalbital-acetaminophen-caffeine [facility](FIORICET, ESGIC) [phone] MG tablet Take 1 tablet by mouth every 6 (six) hours as needed for headache. 03/25/17 03/25/18  Shaune PollackIsaacs, Cameron, MD  cyclobenzaprine (FLEXERIL) 10 MG tablet Take 1 tablet (10 mg total) by mouth 3 (three) times daily as needed for muscle spasms. 03/11/17   Mechele ClaudeStacks, Warren, MD  HYDROcodone-acetaminophen (NORCO/VICODIN) 5-325 MG tablet Take 1-2 tablets by mouth every 4 (four) hours as needed. 08/07/17   Wilfredo Canterbury C, PA-C  lidocaine (LIDODERM) 5 % Place 1 patch onto the skin daily. Remove & Discard patch within 12 hours or as directed by MD 08/07/17   Ray Gervasi C, PA-C  methocarbamol (ROBAXIN) 500 MG tablet Take 1 tablet (500 mg total) by mouth 2 (two) times daily. 05/01/17   Nira Connardama, Pedro Eduardo, MD  methocarbamol (ROBAXIN) 500 MG  tablet Take 1 tablet (500 mg total) by mouth 2 (two) times daily. 08/07/17   Kooper Godshall C, PA-C  ondansetron (ZOFRAN ODT) 4 MG disintegrating tablet Take 1 tablet (4 mg total) by mouth every 8 (eight) hours as needed for nausea or vomiting. 08/07/17   Joydan Gretzinger, Hillard Danker, PA-C  promethazine (PHENERGAN) 25 MG tablet Take 1 tablet (25 mg total) by mouth every 6 (six) hours as needed for nausea or vomiting. 03/25/17   Shaune Pollack, MD    Family History Family History  Problem Relation Age of Onset  . Alcohol abuse Mother   . Asthma Mother   . COPD Mother   .  Depression Mother   . Drug abuse Mother   . Miscarriages / [place] Mother   . Depression Brother   . Learning disabilities Brother   . Depression Daughter   . Miscarriages / [place] Daughter   . Birth defects Son        spinabifida, scoliosis  . Kidney disease Son   . Diabetes Maternal Grandmother   . Heart disease Maternal Grandmother   . Hyperlipidemia Maternal Grandmother   . Hypertension Maternal Grandmother   . Miscarriages / Stillbirths Maternal Grandmother   . Asthma Maternal Grandfather   . COPD Maternal Grandfather   . Cancer Paternal Grandmother   . Birth defects Brother        born with one kidney  . Depression Brother   . Learning disabilities Brother        dislexia    Social History Social History   Tobacco Use  . Smoking status: Never Smoker  . Smokeless tobacco: Never Used  Substance Use Topics  . Alcohol use: Yes    Comment: wine 3x week  . Drug use: No     Allergies   Aspirin; Caffeine; Ibuprofen; and Penicillins   Review of Systems Review of Systems  Constitutional: Negative for chills, diaphoresis and fever.  Respiratory: Negative for cough and shortness of breath.   Cardiovascular: Negative for chest pain.  Gastrointestinal: Positive for nausea and vomiting. Negative for abdominal pain, blood in stool and diarrhea.  Genitourinary: Positive for difficulty urinating. Negative for dysuria, hematuria and vaginal discharge.  Musculoskeletal: Positive for back pain.  Neurological: Negative for weakness and numbness.  All other systems reviewed and are negative.    Physical Exam Updated Vital Signs BP 129/78 (BP Location: Right Arm)   Pulse 74   Temp 98.5 F (36.9 C) (Oral)   Resp 18   SpO2 98%   Physical Exam  Constitutional: She appears well-developed and well-nourished. No distress.  HENT:  Head: Normocephalic and atraumatic.  Eyes: Conjunctivae are normal.  Neck: Neck supple.  Cardiovascular: Normal rate, regular rhythm,  normal heart sounds and intact distal pulses.  Pulmonary/Chest: Effort normal and breath sounds normal. No respiratory distress.  Abdominal: Soft. Bowel sounds are normal. There is no tenderness. There is CVA tenderness (right). There is no guarding.  Musculoskeletal: She exhibits no edema.       Arms: Normal motor function intact in all extremities and spine. No midline spinal tenderness.   Lymphadenopathy:    She has no cervical adenopathy.  Neurological: She is alert.  No noted acute sensory deficits. Strength 5/5 with flexion and extension at the bilateral hips, knees, and ankles. No noted gait deficit. Coordination intact with heel to shin testing.  Skin: Skin is warm and dry. Capillary refill takes less than 2 seconds. She is not diaphoretic.  Psychiatric: She has a  normal mood and affect. Her behavior is normal.  Nursing note and vitals reviewed.    ED Treatments / Results  Labs (all labs ordered are listed, but only abnormal results are displayed) Labs Reviewed  URINALYSIS, ROUTINE W REFLEX MICROSCOPIC - Abnormal; Notable for the following components:      Result Value   Specific Gravity, Urine <1.005 (*)    All other components within normal limits  COMPREHENSIVE METABOLIC PANEL - Abnormal; Notable for the following components:   Creatinine, Ser 1.05 (*)    All other components within normal limits  CBC WITH DIFFERENTIAL/PLATELET    EKG  EKG Interpretation None       Radiology Ct Renal Stone Study  Result Date: 08/07/2017 CLINICAL DATA:  Right side back pain beginning yesterday. EXAM: CT ABDOMEN AND PELVIS WITHOUT CONTRAST TECHNIQUE: Multidetector CT imaging of the abdomen and pelvis was performed following the standard protocol without IV contrast. COMPARISON:  CT abdomen and pelvis 10/31/2016. FINDINGS: Lower chest: Mild dependent atelectasis is seen in the lung bases. No pleural or pericardial effusion. Hepatobiliary: No focal liver abnormality is seen. Status  post cholecystectomy. No biliary dilatation. Pancreas: Unremarkable. No pancreatic ductal dilatation or surrounding inflammatory changes. Spleen: Single small calcification in the spleen is consistent with old granulomatous disease. The spleen otherwise appears normal and is normal in size. Adrenals/Urinary Tract: Small left adrenal adenoma is noted. The right adrenal gland appears normal. 0.3 cm nonobstructing stone lower pole right kidney is seen. No other urinary tract stones are present. No hydronephrosis. Ureters and urinary bladder appear normal. Stomach/Bowel: Stomach is within normal limits. Appendix appears normal. No evidence of bowel wall thickening, distention, or inflammatory changes. Vascular/Lymphatic: No significant vascular findings are present. No enlarged abdominal or pelvic lymph nodes. Reproductive: Uterus and bilateral adnexa are unremarkable. Other: Small fat containing umbilical hernia is seen.  No ascites. Musculoskeletal: Negative. IMPRESSION: No acute abnormality abdomen or pelvis. No finding to explain the patient's symptoms. No change in 0.3 cm nonobstructing stone lower pole right kidney. Electronically Signed   By: Drusilla Kannerhomas  Dalessio M.D.   On: 08/07/2017 13:43    Procedures Procedures (including critical care time)  Medications Ordered in ED Medications  ketorolac (TORADOL) 30 MG/ML injection 30 mg (30 mg Intravenous Given 08/07/17 1206)  ondansetron (ZOFRAN) injection 4 mg (4 mg Intravenous Given 08/07/17 1206)  sodium chloride 0.9 % bolus 1,000 mL (0 mLs Intravenous Stopped 08/07/17 1428)  morphine 4 MG/ML injection 4 mg (4 mg Intravenous Given 08/07/17 1351)     Initial Impression / Assessment and Plan / ED Course  I have reviewed the triage vital signs and the nursing notes.  Pertinent labs & imaging results that were available during my care of the patient were reviewed by me and considered in my medical decision making (see chart for details).  Clinical Course  as of Aug 07 1706  Wed Aug 07, 2017  1305 Patient voices no improvement in her pain.  [SJ]  1350 Discussed imaging and lab results with patient. Patient appears to be in no distress, resting on the bed.  [SJ]    Clinical Course User Index [SJ] Jakalyn Kratky C, PA-C    Patient presents with right lower back pain.  Pain pattern and patient history gives suspicion for renal stone, though muscular pain is also a consideration.  Patient experienced significant improvement with minimal intervention. Lab results encouraging.  No acute renal stone noted on CT and no signs of an explanation for patient's pain.  PCP follow-up. The patient was given instructions for home care as well as return precautions. Patient voices understanding of these instructions, accepts the plan, and is comfortable with discharge.  [place] narcotic database was checked and no entry for this patient was found.  Vitals:   08/07/17 1109 08/07/17 1349  BP: 129/78 101/63  Pulse: 74 66  Resp: 18 16  Temp: 98.5 F (36.9 C) 97.9 F (36.6 C)  TempSrc: Oral Oral  SpO2: 98% 98%     Final Clinical Impressions(s) / ED Diagnoses   Final diagnoses:  Acute right-sided low back pain without sciatica    ED Discharge Orders        Ordered    HYDROcodone-acetaminophen (NORCO/VICODIN) 5-325 MG tablet  Every 4 hours PRN     08/07/17 1419    lidocaine (LIDODERM) 5 %  Every 24 hours     08/07/17 1419    methocarbamol (ROBAXIN) 500 MG tablet  2 times daily     08/07/17 1419    ondansetron (ZOFRAN ODT) 4 MG disintegrating tablet  Every 8 hours PRN     08/07/17 1419       Anselm Pancoast, PA-C 08/07/17 1707    Jacalyn Lefevre, MD 08/08/17 1507

## 2017-08-14 DIAGNOSIS — M5489 Other dorsalgia: Secondary | ICD-10-CM | POA: Diagnosis not present

## 2017-08-15 ENCOUNTER — Emergency Department (HOSPITAL_BASED_OUTPATIENT_CLINIC_OR_DEPARTMENT_OTHER)
Admission: EM | Admit: 2017-08-15 | Discharge: 2017-08-15 | Disposition: A | Discharge status: Home / Self Care | Payer: 59 | Attending: Emergency Medicine | Admitting: Emergency Medicine

## 2017-08-15 ENCOUNTER — Telehealth: Payer: Self-pay | Admitting: Family Medicine

## 2017-08-15 ENCOUNTER — Other Ambulatory Visit: Payer: Self-pay

## 2017-08-15 ENCOUNTER — Encounter (HOSPITAL_BASED_OUTPATIENT_CLINIC_OR_DEPARTMENT_OTHER): Payer: Self-pay

## 2017-08-15 DIAGNOSIS — M545 Low back pain, unspecified: Secondary | ICD-10-CM

## 2017-08-15 DIAGNOSIS — R1032 Left lower quadrant pain: Secondary | ICD-10-CM | POA: Diagnosis present

## 2017-08-15 DIAGNOSIS — Z79899 Other long term (current) drug therapy: Secondary | ICD-10-CM | POA: Diagnosis not present

## 2017-08-15 DIAGNOSIS — E039 Hypothyroidism, unspecified: Secondary | ICD-10-CM | POA: Insufficient documentation

## 2017-08-15 DIAGNOSIS — G8929 Other chronic pain: Secondary | ICD-10-CM | POA: Insufficient documentation

## 2017-08-15 LAB — URINALYSIS, ROUTINE W REFLEX MICROSCOPIC
Bilirubin Urine: NEGATIVE
Glucose, UA: NEGATIVE mg/dL
Ketones, ur: NEGATIVE mg/dL
LEUKOCYTES UA: NEGATIVE
NITRITE: NEGATIVE
Protein, ur: NEGATIVE mg/dL
SPECIFIC GRAVITY, URINE: 1.01 (ref 1.005–1.030)
pH: 6 (ref 5.0–8.0)

## 2017-08-15 LAB — URINALYSIS, MICROSCOPIC (REFLEX)

## 2017-08-15 MED ORDER — FENTANYL CITRATE (PF) 100 MCG/2ML IJ SOLN
50.0000 ug | Freq: Once | INTRAMUSCULAR | Status: AC
Start: 2017-08-15 — End: 2017-08-15
  Administered 2017-08-15: 50 ug via INTRAVENOUS
  Filled 2017-08-15: qty 2

## 2017-08-15 MED ORDER — LIDOCAINE 5 % EX PTCH: 1.0000 | MEDICATED_PATCH | CUTANEOUS | 0 refills | Status: DC

## 2017-08-15 MED ORDER — METHOCARBAMOL 500 MG PO TABS: 500.0000 mg | ORAL_TABLET | Freq: Two times a day (BID) | ORAL | 0 refills | Status: DC

## 2017-08-15 MED ORDER — ONDANSETRON 8 MG PO TBDP: 8.0000 mg | ORAL_TABLET | Freq: Once | ORAL | Status: DC

## 2017-08-15 MED ORDER — ONDANSETRON HCL 4 MG/2ML IJ SOLN
4.0000 mg | Freq: Once | INTRAMUSCULAR | Status: AC
  Administered 2017-08-15: 4 mg via INTRAVENOUS
  Filled 2017-08-15: qty 2

## 2017-08-15 MED ORDER — FENTANYL CITRATE (PF) 100 MCG/2ML IJ SOLN: 50.0000 ug | Freq: Once | INTRAMUSCULAR | Status: DC

## 2017-08-15 MED FILL — METHOCARBAMOL 500 MG TABS: 500 | 10 days supply | Qty: 20 | Fill #0

## 2017-08-15 NOTE — Discharge Instructions (Signed)
Please read instructions below.  Talk with your PCP about any new medications and follow up on your visit today. You can take tylenol every 4 hours as needed for pain.  You can take robaxin 2 times per day for muscle spasm. You can apply a lidoderm patch every 24 hours to your area of pain. Apply ice to your back for 20 minutes at a time. You can also apply heat. Try gentle stretches and massage. Return to ER if new numbness or tingling in your arms or legs, inability to urinate, inability to hold your bowels, or weakness in your extremities.

## 2017-08-15 NOTE — ED Triage Notes (Signed)
C/o left flank/mid back pain started yesterday-states she was seen here last week for same type of pain but to right side last-pacing in triage

## 2017-08-15 NOTE — ED Provider Notes (Signed)
[facility] EMERGENCY DEPARTMENT Provider Note   CSN: [id number] Arrival date & time: 08/15/17  1241     History   Chief Complaint Chief Complaint  Patient presents with  . Flank Pain    HPI Kelli Allison is a 51 y.o. female with past medical history of gastric ulcers, IBS, hypothyroid, nephrolithiasis, presenting to the ED with left-sided lower back pain worsening this morning.  Patient states she has been having problems with bilateral low back/flank pain since February.  Last ED visit was 07/30/2017, with a negative CT renal stone study.  Patient was treated in the ED with significant improvement in symptoms, prescribed hydrocodone, Robaxin, Lidoderm patch, and Zofran.  She states the pain in the right side of her back improved, however the left side began feeling more painful today.  Reports increased urinary frequency and nausea.  Denies bowel or bladder incontinence, lower extremity numbness or weakness, fever, bleeding or discharge, abdominal pain, chest pain, shortness of breath, or other complaints.  She states she saw her kidney doctor this week who confirmed her issue is not due to nephrolithiasis.  States she also has a gastroenterologist whom she is followed by for her IBS.  Not seen her PCP regarding these symptoms.  The history is provided by the patient.    Past Medical History:  Diagnosis Date  . Hearing loss   . Hypothyroidism   . IBS (irritable bowel syndrome)   . Kidney stones   . Migraine   . Obesity   . Stomach ulcer due to nonsteroidal anti-inflammatory drug (NSAID)     Patient Active Problem List   Diagnosis Date Noted  . Obesity   . Abdominal pain, epigastric 01/16/2017  . Change in bowel habits 01/16/2017  . Generalized abdominal pain 12/31/2016  . Nonintractable episodic headache 12/31/2016  . Diarrhea 12/31/2016  . Allergic rhinitis 12/31/2016    Past Surgical History:  Procedure Laterality Date  . CESAREAN SECTION    .  CHOLECYSTECTOMY    . INNER EAR SURGERY    . TUBAL LIGATION      OB History    No data available       Home Medications    Prior to Admission medications   Medication Sig Start Date End Date Taking? Authorizing Provider  butalbital-acetaminophen-caffeine (FIORICET, ESGIC) [phone] MG tablet Take 1 tablet by mouth every 6 (six) hours as needed for headache. 03/25/17 03/25/18  Shaune PollackIsaacs, Cameron, MD  cyclobenzaprine (FLEXERIL) 10 MG tablet Take 1 tablet (10 mg total) by mouth 3 (three) times daily as needed for muscle spasms. 03/11/17   Mechele ClaudeStacks, Warren, MD  diclofenac sodium (VOLTAREN) 1 % GEL Apply 1-2 nickel size amount of Gel onto the affected area up to 3 times a day. 05/01/17   Nira Connardama, Pedro Eduardo, MD  glycopyrrolate (ROBINUL) 2 MG tablet Take 1 tablet (2 mg total) by mouth 2 (two) times daily as needed. 04/23/17   Iva BoopGessner, Carl E, MD  HYDROcodone-acetaminophen (NORCO/VICODIN) 5-325 MG tablet Take 1-2 tablets by mouth every 4 (four) hours as needed. 08/07/17   Joy, Shawn C, PA-C  levothyroxine (SYNTHROID, LEVOTHROID) 75 MCG tablet Take 1 tablet (75 mcg total) by mouth daily before breakfast. 05/16/17   Mechele ClaudeStacks, Warren, MD  lidocaine (LIDODERM) 5 % Place 1 patch onto the skin daily. Remove & Discard patch within 12 hours or as directed by MD 08/15/17   Meleena Munroe, [place] N, PA-C  methocarbamol (ROBAXIN) 500 MG tablet Take 1 tablet (500 mg total) by mouth 2 (  two) times daily. 08/15/17   Ezekiah Massie, [place] N, PA-C  ondansetron (ZOFRAN ODT) 4 MG disintegrating tablet Take 1 tablet (4 mg total) by mouth every 8 (eight) hours as needed for nausea or vomiting. 01/10/17   Zehr, Shanda Bumps D, PA-C  ondansetron (ZOFRAN ODT) 4 MG disintegrating tablet Take 1 tablet (4 mg total) by mouth every 8 (eight) hours as needed for nausea or vomiting. 08/07/17   Joy, Shawn C, PA-C  pantoprazole (PROTONIX) 40 MG tablet Take 1 tablet (40 mg total) by mouth daily before breakfast. 04/23/17   Iva Boop, MD  promethazine  (PHENERGAN) 25 MG tablet Take 1 tablet (25 mg total) by mouth every 6 (six) hours as needed for nausea or vomiting. 03/25/17   Shaune Pollack, MD    Family History Family History  Problem Relation Age of Onset  . Alcohol abuse Mother   . Asthma Mother   . COPD Mother   . Depression Mother   . Drug abuse Mother   . Miscarriages / [place] Mother   . Depression Brother   . Learning disabilities Brother   . Depression Daughter   . Miscarriages / [place] Daughter   . Birth defects Son        spinabifida, scoliosis  . Kidney disease Son   . Diabetes Maternal Grandmother   . Heart disease Maternal Grandmother   . Hyperlipidemia Maternal Grandmother   . Hypertension Maternal Grandmother   . Miscarriages / Stillbirths Maternal Grandmother   . Asthma Maternal Grandfather   . COPD Maternal Grandfather   . Cancer Paternal Grandmother   . Birth defects Brother        born with one kidney  . Depression Brother   . Learning disabilities Brother        dislexia    Social History Social History   Tobacco Use  . Smoking status: Never Smoker  . Smokeless tobacco: Never Used  Substance Use Topics  . Alcohol use: Yes    Comment: wine 3x week  . Drug use: No     Allergies   Aspirin; Caffeine; Ibuprofen; and Penicillins   Review of Systems Review of Systems  Constitutional: Negative for chills and fever.  Respiratory: Negative for shortness of breath.   Cardiovascular: Negative for chest pain.  Gastrointestinal: Positive for nausea. Negative for abdominal pain.  Genitourinary: Positive for flank pain and frequency. Negative for dysuria, vaginal bleeding and vaginal discharge.  Musculoskeletal: Positive for back pain.  All other systems reviewed and are negative.    Physical Exam Updated Vital Signs BP 116/82 (BP Location: Left Arm)   Pulse 79   Temp 97.9 F (36.6 C) (Oral)   Resp 18   Ht 5\' 1"  (1.549 m)   Wt 89.8 kg (198 lb)   SpO2 100%   BMI 37.41 kg/m    Physical Exam  Constitutional: She appears well-developed and well-nourished. No distress.  HENT:  Head: Normocephalic and atraumatic.  Mouth/Throat: Oropharynx is clear and moist.  Eyes: Conjunctivae are normal.  Neck: Normal range of motion. Neck supple.  Cardiovascular: Normal rate, regular rhythm, normal heart sounds and intact distal pulses.  Pulmonary/Chest: Effort normal and breath sounds normal. No respiratory distress.  Abdominal: Soft. Bowel sounds are normal. She exhibits no distension. There is no tenderness. There is no rebound and no guarding.  No CVA tenderness to percussion.  Musculoskeletal:  Left lateral lower back with tenderness over musculature. No spinal or paraspinal tenderness, no bony step-offs, no gross deformities.  Neurological:  She is alert.  Motor:  Normal tone. 5/5 in upper and lower extremities bilaterally including strong and equal grip strength and dorsiflexion/plantar flexion Sensory: Pinprick and light touch normal in all extremities.  Deep Tendon Reflexes: 2+ and symmetric in the biceps and patella Gait: normal gait and balance CV: distal pulses palpable throughout    Skin: Skin is warm.  Psychiatric: She has a normal mood and affect. Her behavior is normal.  Nursing note and vitals reviewed.    ED Treatments / Results  Labs (all labs ordered are listed, but only abnormal results are displayed) Labs Reviewed  URINALYSIS, ROUTINE W REFLEX MICROSCOPIC - Abnormal; Notable for the following components:      Result Value   Hgb urine dipstick TRACE (*)    All other components within normal limits  URINALYSIS, MICROSCOPIC (REFLEX) - Abnormal; Notable for the following components:   Bacteria, UA RARE (*)    Squamous Epithelial / LPF 0-5 (*)    All other components within normal limits    EKG  EKG Interpretation None       Radiology No results found.  Procedures Procedures (including critical care time)  Medications Ordered in  ED Medications  fentaNYL (SUBLIMAZE) injection 50 mcg (50 mcg Intravenous Given 08/15/17 1402)  ondansetron (ZOFRAN) injection 4 mg (4 mg Intravenous Given 08/15/17 1401)     Initial Impression / Assessment and Plan / ED Course  I have reviewed the triage vital signs and the nursing notes.  Pertinent labs & imaging results that were available during my care of the patient were reviewed by me and considered in my medical decision making (see chart for details).    Pt presenting with chronic bilateral lower back pain/flank pain.  Patient seen on 07/30/2017, with negative CT stone study and prescribed symptomatic treatment.  Also seen by her nephrologist since that time.  Patient afebrile, nontoxic-appearing in the ED.  Hemodynamically stable.  Abdomen benign.  No CVA tenderness to percussion, however left lower back tenderness on exam.  Normal neuro exam.  Urine without evidence of infection.  Patient follow-up with her PCP regarding her ongoing symptoms.  Symptomatic management recommended and discussed.  Patient is safe for discharge at this time.  Patient discussed with Dr. Eudelia Bunchardama who agrees with care plan.  Discussed results, findings, treatment and follow up. Patient advised of return precautions. Patient verbalized understanding and agreed with plan.  Final Clinical Impressions(s) / ED Diagnoses   Final diagnoses:  Chronic bilateral low back pain without sciatica    ED Discharge Orders        Ordered    lidocaine (LIDODERM) 5 %  Every 24 hours     08/15/17 1443    methocarbamol (ROBAXIN) 500 MG tablet  2 times daily     08/15/17 1443       Taren Toops, [place] N, [place]PA-C 08/15/17 1452    Nira Connardama, Pedro Eduardo, MD 08/16/17 [phone]

## 2017-08-16 ENCOUNTER — Ambulatory Visit (INDEPENDENT_AMBULATORY_CARE_PROVIDER_SITE_OTHER): Payer: 59 | Admitting: Family Medicine

## 2017-08-16 ENCOUNTER — Encounter: Payer: Self-pay | Admitting: Family Medicine

## 2017-08-16 ENCOUNTER — Ambulatory Visit (INDEPENDENT_AMBULATORY_CARE_PROVIDER_SITE_OTHER): Payer: 59

## 2017-08-16 VITALS — BP 112/66 | HR 68 | Temp 97.3°F | Ht 61.0 in | Wt 198.0 lb

## 2017-08-16 DIAGNOSIS — M546 Pain in thoracic spine: Secondary | ICD-10-CM

## 2017-08-16 DIAGNOSIS — R112 Nausea with vomiting, unspecified: Secondary | ICD-10-CM | POA: Diagnosis not present

## 2017-08-16 DIAGNOSIS — E039 Hypothyroidism, unspecified: Secondary | ICD-10-CM | POA: Diagnosis not present

## 2017-08-16 MED ORDER — GABAPENTIN 300 MG PO CAPS: ORAL_CAPSULE | ORAL | 0 refills | Status: DC

## 2017-08-16 NOTE — Progress Notes (Signed)
Subjective:  Patient ID: Kelli Allison, female    DOB: 1966/02/12  Age: 51 y.o. MRN: [id number]  CC: Back Pain (pt here today c/o back pain and nausea and she has been to the ED twice)   HPI Kelli Allison presents for 3 weeks of right flank pain.  This moved after the first week to the left flank.  She rates it is in/10.  It is described as a ripping sensation and a grabbing sensation.  It has a sharpness that last 2-10 minutes and recurs intermittently.  She is unable to describe how often it occurs.  She relates that it has a somewhat dermatomal distribution coming around from the left thoracic region radiating around toward the midline on the left along what appears to be the T7 dermatome toward the midline.  Patient says that going along with this is a dull headache.  Between the headache and the pain she is having much decreased sleep and the symptoms plus the exhaustion from not sleeping is causing her to have less ability to focus through the day.  She states that this pain in one form or another has been ongoing intermittently since its original onset in February of this year.  Depression screen [facility] 2/9 06/04/2017 05/15/2017 03/11/2017  Decreased Interest 0 0 0  Down, Depressed, Hopeless 0 0 0  PHQ - 2 Score 0 0 0  Altered sleeping - - -  Tired, decreased energy - - -  Change in appetite - - -  Feeling bad or failure about yourself  - - -  Trouble concentrating - - -  Moving slowly or fidgety/restless - - -  Suicidal thoughts - - -  PHQ-9 Score - - -    History Kelli Allison has a past medical history of Hearing loss, Hypothyroidism, IBS (irritable bowel syndrome), Kidney stones, Migraine, Obesity, and Stomach ulcer due to nonsteroidal anti-inflammatory drug (NSAID).   She has a past surgical history that includes Inner ear surgery; Cesarean section; Cholecystectomy; and Tubal ligation.   Her family history includes Alcohol abuse in her mother; Asthma in her maternal grandfather and  mother; Birth defects in her brother and son; COPD in her maternal grandfather and mother; Cancer in her paternal grandmother; Depression in her brother, brother, daughter, and mother; Diabetes in her maternal grandmother; Drug abuse in her mother; Heart disease in her maternal grandmother; Hyperlipidemia in her maternal grandmother; Hypertension in her maternal grandmother; Kidney disease in her son; Learning disabilities in her brother and brother; Miscarriages / [place] in her daughter, maternal grandmother, and mother.She reports that  has never smoked. she has never used smokeless tobacco. She reports that she drinks alcohol. She reports that she does not use drugs.    ROS Review of Systems  Constitutional: Negative for activity change, appetite change and fever.  HENT: Negative for congestion, rhinorrhea and sore throat.   Eyes: Negative for visual disturbance.  Respiratory: Negative for cough and shortness of breath.   Cardiovascular: Negative for chest pain and palpitations.  Gastrointestinal: Positive for abdominal pain, nausea and vomiting. Negative for diarrhea.  Genitourinary: Positive for flank pain. Negative for dysuria.  Musculoskeletal: Positive for back pain. Negative for arthralgias and myalgias.    Objective:  BP 112/66   Pulse 68   Temp (!) 97.3 F (36.3 C) (Oral)   Ht '5\' 1"'$  (1.549 m)   Wt 198 lb (89.8 kg)   BMI 37.41 kg/m   BP Readings from Last 3 Encounters:  08/16/17 112/66  08/15/17 122/77  08/07/17 101/63    Wt Readings from Last 3 Encounters:  08/16/17 198 lb (89.8 kg)  08/15/17 198 lb (89.8 kg)  06/04/17 201 lb (91.2 kg)     Physical Exam  Constitutional: She is oriented to person, place, and time. She appears well-developed and well-nourished. No distress.  HENT:  Head: Normocephalic and atraumatic.  Eyes: Conjunctivae are normal. Pupils are equal, round, and reactive to light.  Neck: Normal range of motion. Neck supple. No thyromegaly  present.  Cardiovascular: Normal rate, regular rhythm and normal heart sounds.  No murmur heard. Pulmonary/Chest: Effort normal and breath sounds normal. No respiratory distress. She has no wheezes. She has no rales.  Abdominal: Soft. Bowel sounds are normal. She exhibits no distension. There is no tenderness.  Musculoskeletal: Normal range of motion.  Lymphadenopathy:    She has no cervical adenopathy.  Neurological: She is alert and oriented to person, place, and time.  Skin: Skin is warm and dry.  Psychiatric: She has a normal mood and affect. Her behavior is normal. Judgment and thought content normal.      Assessment & Plan:   Kelli Allison was seen today for back pain.  Diagnoses and all orders for this visit:  Thoracic back pain, unspecified back pain laterality, unspecified chronicity -     DG Thoracic Spine 2 View; Future  Nausea and vomiting, intractability of vomiting not specified, unspecified vomiting type -     Lipase -     Amylase -     CT ABDOMEN W WO CONTRAST; Future -     CBC with Differential/Platelet -     CMP14+EGFR  Other orders -     gabapentin (NEURONTIN) 300 MG capsule; Take 1 PO x 3 days then 2 PO x 3 days then 3 PO x 3 days and then 4 PO QHS.       I have discontinued Tiyanna H. Sartin's butalbital-acetaminophen-caffeine, promethazine, HYDROcodone-acetaminophen, lidocaine, and methocarbamol. I am also having her start on gabapentin. Additionally, I am having her maintain her cyclobenzaprine, pantoprazole, glycopyrrolate, diclofenac sodium, levothyroxine, and ondansetron.  Allergies as of 08/16/2017      Reactions   Aspirin Nausea Only   Caffeine Other (See Comments)   Or the color in sodas-causes GI problems   Ibuprofen    Stomach ulcers   Penicillins Hives   Has patient had a PCN reaction causing immediate rash, facial/tongue/throat swelling, SOB or lightheadedness with hypotension: YES Has patient had a PCN reaction causing severe rash involving  mucus membranes or skin necrosis: NO Has patient had a PCN reaction that required hospitalization NO Has patient had a PCN reaction occurring within the last 10 years:NO If all of the above answers are "NO", then may proceed with Cephalosporin use.      Medication List        Accurate as of 08/16/17 11:59 PM. Always use your most recent med list.          cyclobenzaprine 10 MG tablet Commonly known as:  FLEXERIL Take 1 tablet (10 mg total) by mouth 3 (three) times daily as needed for muscle spasms.   diclofenac sodium 1 % Gel Commonly known as:  VOLTAREN Apply 1-2 nickel size amount of Gel onto the affected area up to 3 times a day.   gabapentin 300 MG capsule Commonly known as:  NEURONTIN Take 1 PO x 3 days then 2 PO x 3 days then 3 PO x 3 days and then 4 PO QHS.  glycopyrrolate 2 MG tablet Commonly known as:  ROBINUL Take 1 tablet (2 mg total) by mouth 2 (two) times daily as needed.   levothyroxine 75 MCG tablet Commonly known as:  SYNTHROID, LEVOTHROID Take 1 tablet (75 mcg total) by mouth daily before breakfast.   ondansetron 4 MG disintegrating tablet Commonly known as:  ZOFRAN ODT Take 1 tablet (4 mg total) by mouth every 8 (eight) hours as needed for nausea or vomiting.   pantoprazole 40 MG tablet Commonly known as:  PROTONIX Take 1 tablet (40 mg total) by mouth daily before breakfast.        Follow-up: No Follow-up on file.  Claretta Fraise, M.D.

## 2017-08-17 LAB — CMP14+EGFR
ALK PHOS: 91 IU/L (ref 39–117)
ALT: 11 IU/L (ref 0–32)
AST: 14 IU/L (ref 0–40)
Albumin/Globulin Ratio: 1.6 (ref 1.2–2.2)
Albumin: 4.3 g/dL (ref 3.5–5.5)
BUN/Creatinine Ratio: 13 (ref 9–23)
BUN: 10 mg/dL (ref 6–24)
Bilirubin Total: 0.3 mg/dL (ref 0.0–1.2)
CALCIUM: 9.9 mg/dL (ref 8.7–10.2)
CO2: 29 mmol/L (ref 20–29)
CREATININE: 0.79 mg/dL (ref 0.57–1.00)
Chloride: 100 mmol/L (ref 96–106)
GFR calc Af Amer: 102 mL/min/{1.73_m2} (ref >59)
GFR, EST NON AFRICAN AMERICAN: 88 mL/min/{1.73_m2} (ref >59)
GLOBULIN, TOTAL: 2.7 g/dL (ref 1.5–4.5)
GLUCOSE: 95 mg/dL (ref 65–99)
Potassium: 4.1 mmol/L (ref 3.5–5.2)
SODIUM: 143 mmol/L (ref 134–144)
Total Protein: 7 g/dL (ref 6.0–8.5)

## 2017-08-17 LAB — CBC WITH DIFFERENTIAL/PLATELET
BASOS ABS: 0 10*3/uL (ref 0.0–0.2)
Basos: 0 %
EOS (ABSOLUTE): 0.1 10*3/uL (ref 0.0–0.4)
EOS: 1 %
HEMATOCRIT: 42.2 % (ref 34.0–46.6)
Hemoglobin: 14.4 g/dL (ref 11.1–15.9)
IMMATURE GRANULOCYTES: 0 %
Immature Grans (Abs): 0 10*3/uL (ref 0.0–0.1)
LYMPHS ABS: 2 10*3/uL (ref 0.7–3.1)
Lymphs: 23 %
MCH: 29.8 pg (ref 26.6–33.0)
MCHC: 34.1 g/dL (ref 31.5–35.7)
MCV: 87 fL (ref 79–97)
MONOS ABS: 0.7 10*3/uL (ref 0.1–0.9)
Monocytes: 8 %
NEUTROS PCT: 68 %
Neutrophils Absolute: 5.7 10*3/uL (ref 1.4–7.0)
PLATELETS: 251 10*3/uL (ref 150–379)
RBC: 4.84 x10E6/uL (ref 3.77–5.28)
RDW: 14.2 % (ref 12.3–15.4)
WBC: 8.5 10*3/uL (ref 3.4–10.8)

## 2017-08-17 LAB — AMYLASE: AMYLASE: 34 U/L (ref 31–124)

## 2017-08-17 LAB — LIPASE: Lipase: 29 U/L (ref 14–72)

## 2017-08-20 ENCOUNTER — Encounter: Payer: Self-pay | Admitting: Family Medicine

## 2017-08-20 ENCOUNTER — Ambulatory Visit: Payer: 59 | Admitting: Family Medicine

## 2017-08-21 ENCOUNTER — Other Ambulatory Visit: Payer: Self-pay

## 2017-08-21 DIAGNOSIS — E039 Hypothyroidism, unspecified: Secondary | ICD-10-CM

## 2017-08-22 ENCOUNTER — Ambulatory Visit (INDEPENDENT_AMBULATORY_CARE_PROVIDER_SITE_OTHER): Payer: 59 | Admitting: Gastroenterology

## 2017-08-22 ENCOUNTER — Ambulatory Visit (INDEPENDENT_AMBULATORY_CARE_PROVIDER_SITE_OTHER)
Admission: RE | Admit: 2017-08-22 | Discharge: 2017-08-22 | Disposition: A | Discharge status: Home / Self Care | Payer: 59 | Source: Ambulatory Visit | Attending: Gastroenterology | Admitting: Gastroenterology

## 2017-08-22 ENCOUNTER — Encounter: Payer: Self-pay | Admitting: Gastroenterology

## 2017-08-22 VITALS — BP 106/70 | HR 76 | Ht 61.0 in | Wt 197.2 lb

## 2017-08-22 DIAGNOSIS — R112 Nausea with vomiting, unspecified: Secondary | ICD-10-CM | POA: Insufficient documentation

## 2017-08-22 DIAGNOSIS — R1011 Right upper quadrant pain: Secondary | ICD-10-CM

## 2017-08-22 DIAGNOSIS — R109 Unspecified abdominal pain: Secondary | ICD-10-CM | POA: Diagnosis not present

## 2017-08-22 LAB — SPECIMEN STATUS REPORT

## 2017-08-22 LAB — TSH: TSH: 3.35 u[IU]/mL (ref 0.450–4.500)

## 2017-08-22 LAB — T4, FREE: FREE T4: 0.95 ng/dL (ref 0.82–1.77)

## 2017-08-22 MED ORDER — IOPAMIDOL (ISOVUE-300) INJECTION 61%
100.0000 mL | Freq: Once | INTRAVENOUS | Status: AC | PRN
  Administered 2017-08-22: 100 mL via INTRAVENOUS

## 2017-08-22 MED ORDER — TRAMADOL HCL 50 MG PO TABS: 50.0000 mg | ORAL_TABLET | Freq: Three times a day (TID) | ORAL | 0 refills | Status: DC | PRN

## 2017-08-22 NOTE — Progress Notes (Addendum)
08/22/2017 RHIYA WATCHMAN [id number] [birth year]   HISTORY OF PRESENT ILLNESS: This is a 51 year old female who is known to Dr. Concha Se.  She is here today with complaints of right upper quadrant abdominal pain.  She tells me this pain has been present for months, but seems to be getting worse and more frequent.  She says that she always has some degree of discomfort, but it becomes very severe and disabling where she cannot function at home or at work due to the pain.  She says that the pain also traveled across her back and into her left upper quadrant.  she is tearful in our office today.  She complains of some degree of nausea constantly, but only has vomiting when her pain is severe.  She does not have a gallbladder.  She had EGD and colonoscopy earlier this year.  Colonoscopy was normal.  EGD showed some evidence of reflux and she has been on pantoprazole 40 mg daily for that.  She also takes Robinul once or twice a day for some irritable bowel type symptoms.  She has had 2 CT scans of the abdomen pelvis without contrast and they have been unremarkable.  She even saw her urologist and they said that nothing in her urinary tract is causing her symptoms.  She has seen a chiropractor and physical therapy without improvement.  She has been on gabapentin without improvement.  She tried a muscle relaxant without improvement.  Her PCP has already ordered a CT scan of the abdomen pelvis with contrast, but that has not been performed due to the weather.  CBC, CMP, lipase, amylase have all been unremarkable.   Past Medical History:  Diagnosis Date  . Hearing loss   . Hypothyroidism   . IBS (irritable bowel syndrome)   . Kidney stones   . Migraine   . Obesity   . Stomach ulcer due to nonsteroidal anti-inflammatory drug (NSAID)    Past Surgical History:  Procedure Laterality Date  . CESAREAN SECTION    . CHOLECYSTECTOMY    . INNER EAR SURGERY    . TUBAL LIGATION      reports that  has never  smoked. she has never used smokeless tobacco. She reports that she drinks alcohol. She reports that she does not use drugs. family history includes Alcohol abuse in her mother; Asthma in her maternal grandfather and mother; Birth defects in her brother and son; COPD in her maternal grandfather and mother; Cancer in her paternal grandmother; Depression in her brother, brother, daughter, and mother; Diabetes in her maternal grandmother; Drug abuse in her mother; Heart disease in her maternal grandmother; Hyperlipidemia in her maternal grandmother; Hypertension in her maternal grandmother; Kidney disease in her son; Learning disabilities in her brother and brother; Miscarriages / [place] in her daughter, maternal grandmother, and mother. Allergies  Allergen Reactions  . Aspirin Nausea Only  . Caffeine Other (See Comments)    Or the color in sodas-causes GI problems  . Ibuprofen     Stomach ulcers   . Penicillins Hives    Has patient had a PCN reaction causing immediate rash, facial/tongue/throat swelling, SOB or lightheadedness with hypotension: YES Has patient had a PCN reaction causing severe rash involving mucus membranes or skin necrosis: NO Has patient had a PCN reaction that required hospitalization NO Has patient had a PCN reaction occurring within the last 10 years:NO If all of the above answers are "NO", then may proceed with Cephalosporin use.  Outpatient Encounter Medications as of 08/22/2017  Medication Sig  . diclofenac sodium (VOLTAREN) 1 % GEL Apply 1-2 nickel size amount of Gel onto the affected area up to 3 times a day. (Patient taking differently: Apply 1-2 nickel size amount of Gel onto the affected area as needed)  . gabapentin (NEURONTIN) 300 MG capsule Take 1 PO x 3 days then 2 PO x 3 days then 3 PO x 3 days and then 4 PO QHS.  Marland Kitchen glycopyrrolate (ROBINUL) 2 MG tablet Take 1 tablet (2 mg total) by mouth 2 (two) times daily as needed.  Marland Kitchen levothyroxine (SYNTHROID,  LEVOTHROID) 75 MCG tablet Take 1 tablet (75 mcg total) by mouth daily before breakfast.  . pantoprazole (PROTONIX) 40 MG tablet Take 1 tablet (40 mg total) by mouth daily before breakfast.  . [DISCONTINUED] cyclobenzaprine (FLEXERIL) 10 MG tablet Take 1 tablet (10 mg total) by mouth 3 (three) times daily as needed for muscle spasms.  . [DISCONTINUED] ondansetron (ZOFRAN ODT) 4 MG disintegrating tablet Take 1 tablet (4 mg total) by mouth every 8 (eight) hours as needed for nausea or vomiting.   No facility-administered encounter medications on file as of 08/22/2017.      REVIEW OF SYSTEMS  : All other systems reviewed and negative except where noted in the History of Present Illness.   PHYSICAL EXAM: BP 106/70 (BP Location: Left Arm, Patient Position: Sitting, Cuff Size: Large)   Pulse 76   Ht 5\' 1"  (1.549 m) Comment: height measured without shoes  Wt 197 lb 4 oz (89.5 kg)   BMI 37.27 kg/m  General: Well developed white female in no acute distress; tearful Head: Normocephalic and atraumatic Eyes:  Sclerae anicteric, conjunctiva pink. Ears: Normal auditory acuity Lungs: Clear throughout to auscultation; no increased WOB. Heart: Regular rate and rhythm; no M/R/G. Abdomen: Soft, non-distended.  BS present.  RUQ TTP. Musculoskeletal: Symmetrical with no gross deformities  Skin: No lesions on visible extremities Extremities: No edema  Neurological: Alert oriented x 4, grossly non-focal Psychological:  Alert and cooperative. Normal mood and affect  ASSESSMENT AND PLAN: *RUQ abdominal pain: Some degree of pain constantly, but intermittent severe pains.  Extensive evaluation thus far has been unrevealing and several treatment regimens have failed to allow improvement in her symptoms.  I am unsure of what this pain represents, but I am also unsure if it is GI related.  Her PCP had scheduled for a CT scan of the abdomen and pelvis with contrast, but due to the weather that has not yet been  performed.  We will go ahead and order that and schedule for her.  She was actually able to get in to have that performed this afternoon.  I am going to put her out of work for today and tomorrow.  I gave her some tramadol to try for pain in the interim.   CC:  Mechele Claude, MD  Await CT Agree with Ms. Jaleesa Cervi's management.  Iva Boop, MD, Clement[facility] Graham

## 2017-08-22 NOTE — Patient Instructions (Addendum)
If you are age 49 or older, your body mass index should be between 23-30. Your Body mass index is 37.27 kg/m. If this is out of the aforementioned range listed, please consider follow up with your Primary Care Provider.  If you are age 58 or younger, your body mass index should be between 19-25. Your Body mass index is 37.27 kg/m. If this is out of the aformentioned range listed, please consider follow up with your Primary Care Provider.   We have sent the following medications to your pharmacy for you to pick up at your convenience:  Tramadol  You have been scheduled for a CT scan of the abdomen and pelvis at [place] ([address]---this is in the same building as Press [facility]).   You are scheduled on Thursday, December 13th at 3:45pm. You should arrive 15 minutes prior to your appointment time for registration. Please follow the written instructions below on the day of your exam:  WARNING: IF YOU ARE ALLERGIC TO IODINE/X-RAY DYE, PLEASE NOTIFY RADIOLOGY IMMEDIATELY AT [phone]! YOU WILL BE GIVEN A 13 HOUR PREMEDICATION PREP.  1) Do not eat anything after 11:45am (4 hours prior to your test) You may have liquids. 2) You have been given 2 bottles of oral contrast to drink. The solution may taste               better if refrigerated, but do NOT add ice or any other liquid to this solution. Shake             well before drinking.    Drink 1 bottle of contrast @ 2:30pm (2 hours prior to your exam)  Drink 1 bottle of contrast @ 3:15pm (1 hour prior to your exam)  You may take any medications as prescribed with a small amount of water except for the following: Metformin, Glucophage, Glucovance, Avandamet, Riomet, Fortamet, Actoplus Met, Janumet, Glumetza or Metaglip. The above medications must be held the day of the exam AND 48 hours after the exam.  The purpose of you drinking the oral contrast is to aid in the visualization of your intestinal tract. The contrast  solution may cause some diarrhea. Before your exam is started, you will be given a small amount of fluid to drink. Depending on your individual set of symptoms, you may also receive an intravenous injection of x-ray contrast/dye. Plan on being at [facility] for 30 minutes or longer, depending on the type of exam you are having performed.  This test typically takes 30-45 minutes to complete.  If you have any questions regarding your exam or if you need to reschedule, you may call the CT department at [phone] between the hours of 8:00 am and 5:00 pm, Monday-Friday.  ________________________________________________________________________

## 2017-08-23 ENCOUNTER — Other Ambulatory Visit: Payer: 59

## 2017-08-23 NOTE — Progress Notes (Signed)
I think we must conclude that her pain is not coming from GI tract  Perhaps it is spinal in origin  Am sorry but do not think we can help her with what we know and do  Please let her know - I have cced her PCP  She will need to seek his help again

## 2017-08-26 ENCOUNTER — Telehealth: Payer: Self-pay | Admitting: Family Medicine

## 2017-08-26 ENCOUNTER — Telehealth: Payer: Self-pay | Admitting: Gastroenterology

## 2017-08-26 NOTE — Telephone Encounter (Signed)
The pt has been advised and will call her pcp

## 2017-08-26 NOTE — Telephone Encounter (Signed)
Notes recorded by Iva BoopGessner, Carl E, MD on 08/23/2017 at 1:42 PM EST I think we must conclude that her pain is not coming from GI tract  Perhaps it is spinal in origin  Am sorry but do not think we can help her with what we know and do  Please let her know - I have cced her PCP  She will need to seek his help again

## 2017-08-26 NOTE — Telephone Encounter (Signed)
Patient wanting CT results from 12.13.18. And pt states she is still having pain with low grade fever now and vomiting after each meal.

## 2017-08-27 NOTE — Telephone Encounter (Signed)
GI feels her pain is not coming from her GI tract. It seems to be related to her neuromuscular system and should be addressed by her pain doctor. WS

## 2017-09-05 NOTE — Telephone Encounter (Signed)
Attempted to contact patient- no call back. This encounter will be closed. 

## 2017-09-05 NOTE — Telephone Encounter (Signed)
Patient was seen 12/7- this encounter will be closed.

## 2017-09-25 ENCOUNTER — Ambulatory Visit (INDEPENDENT_AMBULATORY_CARE_PROVIDER_SITE_OTHER): Payer: 59 | Admitting: Family Medicine

## 2017-09-25 ENCOUNTER — Encounter: Payer: Self-pay | Admitting: Family Medicine

## 2017-09-25 VITALS — BP 96/65 | HR 80 | Temp 97.4°F | Ht 61.0 in | Wt 194.0 lb

## 2017-09-25 DIAGNOSIS — J309 Allergic rhinitis, unspecified: Secondary | ICD-10-CM | POA: Diagnosis not present

## 2017-09-25 DIAGNOSIS — E039 Hypothyroidism, unspecified: Secondary | ICD-10-CM

## 2017-09-25 DIAGNOSIS — R1084 Generalized abdominal pain: Secondary | ICD-10-CM | POA: Diagnosis not present

## 2017-09-25 MED ORDER — GABAPENTIN 300 MG PO CAPS: 1200.0000 mg | ORAL_CAPSULE | Freq: Every day | ORAL | 2 refills | Status: DC

## 2017-09-25 MED ORDER — LEVOTHYROXINE SODIUM 75 MCG PO TABS: 75.0000 ug | ORAL_TABLET | Freq: Every day | ORAL | 1 refills | Status: DC

## 2017-09-25 NOTE — Progress Notes (Signed)
Subjective:  Patient ID: Kelli Allison, female    DOB: [birth year]  Age: 52 y.o. MRN: [id number]  CC: Hypothyroidism (pt here today for routine follow up of chronic medical conditions)   HPI Kelli Allison presents for patient presents for follow-up on  thyroid. The patient has a history of hypothyroidism for many years. It has been stable recently. Pt. denies any change in  voice, loss of hair, heat or cold intolerance. Energy level has been adequate to good. Patient denies constipation and diarrhea. No myxedema. Medication is as noted below. Verified that pt is taking it daily on an empty stomach. Well tolerated.  GI symptoms have completely resolved.  Resolved when she moved away from the farm and boyfriend.  She is concerned that perhaps there was something in the water or that somebody might of been trying to hurt her or that she is allergic to something in the farmhouse. Depression screen [facility] 2/9 09/25/2017 06/04/2017 05/15/2017  Decreased Interest 0 0 0  Down, Depressed, Hopeless 1 0 0  PHQ - 2 Score 1 0 0  Altered sleeping - - -  Tired, decreased energy - - -  Change in appetite - - -  Feeling bad or failure about yourself  - - -  Trouble concentrating - - -  Moving slowly or fidgety/restless - - -  Suicidal thoughts - - -  PHQ-9 Score - - -    History Guerline has a past medical history of Hearing loss, Hypothyroidism, IBS (irritable bowel syndrome), Kidney stones, Migraine, Obesity, and Stomach ulcer due to nonsteroidal anti-inflammatory drug (NSAID).   She has a past surgical history that includes Inner ear surgery; Cesarean section; Cholecystectomy; and Tubal ligation.   Her family history includes Alcohol abuse in her mother; Asthma in her maternal grandfather and mother; Birth defects in her brother and son; COPD in her maternal grandfather and mother; Cancer in her paternal grandmother; Depression in her brother, brother, daughter, and mother; Diabetes in her maternal  grandmother; Drug abuse in her mother; Heart disease in her maternal grandmother; Hyperlipidemia in her maternal grandmother; Hypertension in her maternal grandmother; Kidney disease in her son; Learning disabilities in her brother and brother; Miscarriages / [place] in her daughter, maternal grandmother, and mother.She reports that  has never smoked. she has never used smokeless tobacco. She reports that she drinks alcohol. She reports that she does not use drugs.    ROS Review of Systems  Constitutional: Negative for activity change, appetite change and fever.  HENT: Negative for congestion, rhinorrhea and sore throat.   Eyes: Negative for visual disturbance.  Respiratory: Negative for cough and shortness of breath.   Cardiovascular: Negative for chest pain and palpitations.  Gastrointestinal: Negative for abdominal pain, diarrhea and nausea.  Genitourinary: Negative for dysuria.  Musculoskeletal: Negative for arthralgias and myalgias.    Objective:  BP 96/65   Pulse 80   Temp (!) 97.4 F (36.3 C) (Oral)   Ht '5\' 1"'$  (1.549 m)   Wt 194 lb (88 kg)   BMI 36.66 kg/m   BP Readings from Last 3 Encounters:  09/25/17 96/65  08/22/17 106/70  08/16/17 112/66    Wt Readings from Last 3 Encounters:  09/25/17 194 lb (88 kg)  08/22/17 197 lb 4 oz (89.5 kg)  08/16/17 198 lb (89.8 kg)     Physical Exam  Constitutional: She is oriented to person, place, and time. She appears well-developed and well-nourished. No distress.  HENT:  Head: Normocephalic and  atraumatic.  Right Ear: External ear normal.  Left Ear: External ear normal.  Nose: Nose normal.  Mouth/Throat: Oropharynx is clear and moist.  Eyes: Conjunctivae and EOM are normal. Pupils are equal, round, and reactive to light.  Neck: Normal range of motion. Neck supple. No thyromegaly present.  Cardiovascular: Normal rate, regular rhythm and normal heart sounds.  No murmur heard. Pulmonary/Chest: Effort normal and breath  sounds normal. No respiratory distress. She has no wheezes. She has no rales.  Abdominal: Soft. Bowel sounds are normal. She exhibits no distension. There is no tenderness.  Lymphadenopathy:    She has no cervical adenopathy.  Neurological: She is alert and oriented to person, place, and time. She has normal reflexes.  Skin: Skin is warm and dry.  Psychiatric: She has a normal mood and affect. Her behavior is normal. Judgment and thought content normal.      Assessment & Plan:   Maecy was seen today for hypothyroidism.  Diagnoses and all orders for this visit:  Hypothyroidism, unspecified type  Allergic rhinitis, unspecified seasonality, unspecified trigger -     Ambulatory referral to Allergy  Generalized abdominal pain -     CMP14+EGFR -     Heavy Metals, Blood  Other orders -     gabapentin (NEURONTIN) 300 MG capsule; Take 4 capsules (1,200 mg total) by mouth at bedtime. Take 1 PO x 3 days then 2 PO x 3 days then 3 PO x 3 days and then 4 PO QHS. -     levothyroxine (SYNTHROID, LEVOTHROID) 75 MCG tablet; Take 1 tablet (75 mcg total) by mouth daily before breakfast.       I have changed Emilyann H. Odette's gabapentin. I am also having her maintain her pantoprazole, glycopyrrolate, diclofenac sodium, traMADol, and levothyroxine.  Allergies as of 09/25/2017      Reactions   Aspirin Nausea Only   Caffeine Other (See Comments)   Or the color in sodas-causes GI problems   Ibuprofen    Stomach ulcers   Penicillins Hives   Has patient had a PCN reaction causing immediate rash, facial/tongue/throat swelling, SOB or lightheadedness with hypotension: YES Has patient had a PCN reaction causing severe rash involving mucus membranes or skin necrosis: NO Has patient had a PCN reaction that required hospitalization NO Has patient had a PCN reaction occurring within the last 10 years:NO If all of the above answers are "NO", then may proceed with Cephalosporin use.      Medication  List        Accurate as of 09/25/17 11:59 PM. Always use your most recent med list.          diclofenac sodium 1 % Gel Commonly known as:  VOLTAREN Apply 1-2 nickel size amount of Gel onto the affected area up to 3 times a day.   gabapentin 300 MG capsule Commonly known as:  NEURONTIN Take 4 capsules (1,200 mg total) by mouth at bedtime. Take 1 PO x 3 days then 2 PO x 3 days then 3 PO x 3 days and then 4 PO QHS.   glycopyrrolate 2 MG tablet Commonly known as:  ROBINUL Take 1 tablet (2 mg total) by mouth 2 (two) times daily as needed.   levothyroxine 75 MCG tablet Commonly known as:  SYNTHROID, LEVOTHROID Take 1 tablet (75 mcg total) by mouth daily before breakfast.   pantoprazole 40 MG tablet Commonly known as:  PROTONIX Take 1 tablet (40 mg total) by mouth daily before breakfast.  traMADol 50 MG tablet Commonly known as:  ULTRAM Take 1 tablet (50 mg total) by mouth every 8 (eight) hours as needed.        Follow-up: Return in about 6 months (around 03/25/2018).  Claretta Fraise, M.D.

## 2017-09-25 NOTE — Patient Instructions (Signed)
Keep a diary of your diet:  Eliminate bread, pasta, corn, potatoes and sweets.  Emphasize lean proteins and fish and eat lots of veggies.

## 2017-09-26 ENCOUNTER — Encounter: Payer: Self-pay | Admitting: Family Medicine

## 2017-09-26 LAB — CMP14+EGFR
ALBUMIN: 4.6 g/dL (ref 3.5–5.5)
ALT: 12 IU/L (ref 0–32)
AST: 17 IU/L (ref 0–40)
Albumin/Globulin Ratio: 1.9 (ref 1.2–2.2)
Alkaline Phosphatase: 97 IU/L (ref 39–117)
BUN / CREAT RATIO: 12 (ref 9–23)
BUN: 10 mg/dL (ref 6–24)
Bilirubin Total: 0.2 mg/dL (ref 0.0–1.2)
CALCIUM: 9.3 mg/dL (ref 8.7–10.2)
CHLORIDE: 101 mmol/L (ref 96–106)
CO2: 23 mmol/L (ref 20–29)
CREATININE: 0.82 mg/dL (ref 0.57–1.00)
GFR, EST AFRICAN AMERICAN: 97 mL/min/{1.73_m2} (ref >59)
GFR, EST NON AFRICAN AMERICAN: 84 mL/min/{1.73_m2} (ref >59)
GLUCOSE: 124 mg/dL — AB (ref 65–99)
Globulin, Total: 2.4 g/dL (ref 1.5–4.5)
Potassium: 4.2 mmol/L (ref 3.5–5.2)
Sodium: 140 mmol/L (ref 134–144)
TOTAL PROTEIN: 7 g/dL (ref 6.0–8.5)

## 2017-09-26 LAB — HEAVY METALS, BLOOD
Arsenic: 7 ug/L (ref 2–23)
Lead, Blood: NOT DETECTED ug/dL (ref 0–4)
MERCURY: NOT DETECTED ug/L (ref 0.0–14.9)

## 2017-11-07 ENCOUNTER — Encounter: Payer: Self-pay | Admitting: Family Medicine

## 2017-11-07 ENCOUNTER — Ambulatory Visit (INDEPENDENT_AMBULATORY_CARE_PROVIDER_SITE_OTHER): Payer: 59 | Admitting: Family Medicine

## 2017-11-07 VITALS — BP 113/81 | HR 73 | Temp 97.0°F | Ht 61.0 in | Wt 196.4 lb

## 2017-11-07 DIAGNOSIS — H9201 Otalgia, right ear: Secondary | ICD-10-CM

## 2017-11-07 DIAGNOSIS — R059 Cough, unspecified: Secondary | ICD-10-CM

## 2017-11-07 DIAGNOSIS — R05 Cough: Secondary | ICD-10-CM

## 2017-11-07 DIAGNOSIS — R52 Pain, unspecified: Secondary | ICD-10-CM | POA: Diagnosis not present

## 2017-11-07 LAB — VERITOR FLU A/B WAIVED
INFLUENZA A: NEGATIVE
INFLUENZA B: NEGATIVE

## 2017-11-07 MED ORDER — AZITHROMYCIN 250 MG PO TABS: ORAL_TABLET | ORAL | 0 refills | Status: DC

## 2017-11-07 MED ORDER — FLUTICASONE PROPIONATE 50 MCG/ACT NA SUSP: 2.0000 | Freq: Every day | NASAL | 6 refills | Status: DC

## 2017-11-07 MED ORDER — LEVOCETIRIZINE DIHYDROCHLORIDE 5 MG PO TABS: 5.0000 mg | ORAL_TABLET | Freq: Every evening | ORAL | 1 refills | Status: DC

## 2017-11-07 MED ORDER — METHYLPREDNISOLONE ACETATE 80 MG/ML IJ SUSP
80.0000 mg | Freq: Once | INTRAMUSCULAR | Status: AC
  Administered 2017-11-07: 80 mg via INTRAMUSCULAR

## 2017-11-07 NOTE — Patient Instructions (Addendum)
Great to meet you!  Between the zpack and the injection I think you will see good improvement.   There could be a component of allergies so start flonase and xyzal ( this is OTC but I have prescribed it as some insurances pay for it)

## 2017-11-07 NOTE — Progress Notes (Signed)
   HPI  Patient presents today illness.  Patient complains of 2-3 days of cough, congestion, mild shortness of breath, chest tightness, and mild body aches.  She denies any sinus pain or pressure.  She also has some irritation and discomfort of her right ear. She has had multiple operations on the right ear  PMH: Smoking status noted ROS: Per HPI  Objective: BP 113/81   Pulse 73   Temp (!) 97 F (36.1 C) (Oral)   Ht 5\' 1"  (1.549 m)   Wt 196 lb 6.4 oz (89.1 kg)   SpO2 95%   BMI 37.11 kg/m  Gen: NAD, alert, cooperative with exam HEENT: NCAT, pharynx moist and clear, right TM with no typical landmarks with white discoloration and erythema CV: RRR, good S1/S2, no murmur Resp: CTABL, no wheezes, non-labored Ext: No edema, warm Neuro: Alert and oriented, No gross deficits  Assessment and plan:  #otalgia, cough Patient with persistent cough, mild shortness of breath, and right ear pain. Patient status post multiple ear surgeries and has an unusual appearing TMs, is difficult to say if this is acutely infected normal, I have erred on the side of treatment given her azithromycin. IM Depo-Medrol also given. Flonase plus Xyzal also recommended which were both sent to her pharmacy.     Orders Placed This Encounter  Procedures  . Veritor Flu A/B Waived    Order Specific Question:   Source    Answer:   nasal    Meds ordered this encounter  Medications  . fluticasone (FLONASE) 50 MCG/ACT nasal spray    Sig: Place 2 sprays into both nostrils daily.    Dispense:  16 g    Refill:  6  . methylPREDNISolone acetate (DEPO-MEDROL) injection 80 mg  . azithromycin (ZITHROMAX) 250 MG tablet    Sig: Take 2 tablets on day 1 and 1 tablet daily after that    Dispense:  6 tablet    Refill:  0  . levocetirizine (XYZAL) 5 MG tablet    Sig: Take 1 tablet (5 mg total) by mouth every evening.    Dispense:  90 tablet    Refill:  1    Murtis SinkSam Bradshaw, MD [facility] 11/07/2017, 10:19 AM

## 2017-11-12 ENCOUNTER — Ambulatory Visit: Payer: 59 | Admitting: Allergy and Immunology

## 2017-12-17 ENCOUNTER — Ambulatory Visit: Payer: 59 | Admitting: Allergy and Immunology

## 2017-12-26 ENCOUNTER — Ambulatory Visit: Payer: 59 | Admitting: Family Medicine

## 2017-12-30 ENCOUNTER — Encounter: Payer: Self-pay | Admitting: Family Medicine

## 2017-12-30 ENCOUNTER — Ambulatory Visit (INDEPENDENT_AMBULATORY_CARE_PROVIDER_SITE_OTHER): Payer: 59 | Admitting: Family Medicine

## 2017-12-30 VITALS — BP 106/66 | HR 66 | Temp 98.6°F | Ht 61.0 in | Wt 200.1 lb

## 2017-12-30 DIAGNOSIS — Z6837 Body mass index (BMI) 37.0-37.9, adult: Secondary | ICD-10-CM | POA: Diagnosis not present

## 2017-12-30 DIAGNOSIS — E039 Hypothyroidism, unspecified: Secondary | ICD-10-CM

## 2017-12-30 DIAGNOSIS — E669 Obesity, unspecified: Secondary | ICD-10-CM

## 2017-12-30 MED ORDER — GABAPENTIN 300 MG PO CAPS: 1200.0000 mg | ORAL_CAPSULE | Freq: Every day | ORAL | 2 refills | Status: DC

## 2017-12-30 MED ORDER — LEVOTHYROXINE SODIUM 75 MCG PO TABS: 75.0000 ug | ORAL_TABLET | Freq: Every day | ORAL | 1 refills | Status: DC

## 2017-12-30 NOTE — Progress Notes (Signed)
Subjective:  Patient ID: Kelli Allison, female    DOB: [birth year]  Age: 52 y.o. MRN: [id number]  CC: Hypothyroidism (pt here today for routine follow up of her chronic medical conditions and also wants to discuss her weight)   HPI Kelli Allison presents for patient presents for follow-up on  thyroid. The patient has a history of hypothyroidism for many years. It has been stable recently. Pt. denies any change in  voice, loss of hair, . Energy level has been adequate to good. Patient denies constipation and diarrhea. No myxedema. Medication is as noted below. Verified that pt is taking it daily on an empty stomach. Well tolerated.  Patient states that she continues to gain weight in spite of attempting to lose weight.  She is limiting her calories to about 1000/day.  She has been counting them daily for several months and does not understand why she is continuing to gain weight in spite of all of her attempts to lose.  She does have some cold intolerance.   Her GI symptoms have remitted.  She continues to use medication with good relief.  She also has some allergy symptoms for which she has been using Flonase and Xyzal.  These have kept her allergic rhinitis symptoms under good control. Depression screen [facility] 2/9 12/30/2017 11/07/2017 09/25/2017  Decreased Interest 0 0 0  Down, Depressed, Hopeless 0 0 1  PHQ - 2 Score 0 0 1  Altered sleeping - - -  Tired, decreased energy - - -  Change in appetite - - -  Feeling bad or failure about yourself  - - -  Trouble concentrating - - -  Moving slowly or fidgety/restless - - -  Suicidal thoughts - - -  PHQ-9 Score - - -    History Kimber has a past medical history of Hearing loss, Hypothyroidism, IBS (irritable bowel syndrome), Kidney stones, Migraine, Obesity, and Stomach ulcer due to nonsteroidal anti-inflammatory drug (NSAID).   She has a past surgical history that includes Inner ear surgery; Cesarean section; Cholecystectomy; and Tubal  ligation.   Her family history includes Alcohol abuse in her mother; Asthma in her maternal grandfather and mother; Birth defects in her brother and son; COPD in her maternal grandfather and mother; Cancer in her paternal grandmother; Depression in her brother, brother, daughter, and mother; Diabetes in her maternal grandmother; Drug abuse in her mother; Heart disease in her maternal grandmother; Hyperlipidemia in her maternal grandmother; Hypertension in her maternal grandmother; Kidney disease in her son; Learning disabilities in her brother and brother; Miscarriages / [place] in her daughter, maternal grandmother, and mother.She reports that she has never smoked. She has never used smokeless tobacco. She reports that she drinks alcohol. She reports that she does not use drugs.    ROS Review of Systems  Constitutional: Negative.   HENT: Negative for congestion.   Eyes: Negative for visual disturbance.  Respiratory: Negative for shortness of breath.   Cardiovascular: Negative for chest pain.  Gastrointestinal: Negative for abdominal pain, constipation, diarrhea, nausea and vomiting.  Endocrine: Positive for cold intolerance. Negative for heat intolerance.  Genitourinary: Negative for difficulty urinating.  Musculoskeletal: Negative for arthralgias and myalgias.  Neurological: Negative for headaches.  Psychiatric/Behavioral: Negative for sleep disturbance.    Objective:  BP 106/66   Pulse 66   Temp 98.6 F (37 C) (Oral)   Ht '5\' 1"'$  (1.549 m)   Wt 200 lb 2 oz (90.8 kg)   BMI 37.81 kg/m   BP  Readings from Last 3 Encounters:  12/30/17 106/66  11/07/17 113/81  09/25/17 96/65    Wt Readings from Last 3 Encounters:  12/30/17 200 lb 2 oz (90.8 kg)  11/07/17 196 lb 6.4 oz (89.1 kg)  09/25/17 194 lb (88 kg)     Physical Exam  Constitutional: She is oriented to person, place, and time. She appears well-developed and well-nourished. No distress.  HENT:  Head: Normocephalic and  atraumatic.  Right Ear: External ear normal.  Left Ear: External ear normal.  Nose: Nose normal.  Mouth/Throat: Oropharynx is clear and moist.  Eyes: Pupils are equal, round, and reactive to light. Conjunctivae and EOM are normal.  Neck: Normal range of motion. Neck supple. No thyromegaly present.  Cardiovascular: Normal rate, regular rhythm and normal heart sounds.  No murmur heard. Pulmonary/Chest: Effort normal and breath sounds normal. No respiratory distress. She has no wheezes. She has no rales.  Abdominal: Soft. Bowel sounds are normal. She exhibits no distension. There is no tenderness.  Lymphadenopathy:    She has no cervical adenopathy.  Neurological: She is alert and oriented to person, place, and time. She has normal reflexes.  Skin: Skin is warm and dry.  Psychiatric: She has a normal mood and affect. Her behavior is normal. Judgment and thought content normal.      Assessment & Plan:   Erina was seen today for hypothyroidism.  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 Take 1 PO x 3 days then 2 PO x 3 days then 3 PO x 3 days and then 4 PO QHS. -     levothyroxine (SYNTHROID, LEVOTHROID) 75 MCG tablet; Take 1 tablet (75 mcg total) by mouth daily before breakfast.       I have discontinued Esmae H. Degollado's traMADol and azithromycin. I am also having her maintain her pantoprazole, glycopyrrolate, diclofenac sodium, fluticasone, levocetirizine, gabapentin, and levothyroxine.  Allergies as of 12/30/2017      Reactions   Aspirin Nausea Only   Caffeine  Other (See Comments)   Or the color in sodas-causes GI problems   Ibuprofen    Stomach ulcers   Penicillins Hives   Has patient had a PCN reaction causing immediate rash, facial/tongue/throat swelling, SOB or lightheadedness with hypotension: YES Has patient had a PCN reaction causing severe rash involving mucus membranes or skin necrosis: NO Has patient had a PCN reaction that required hospitalization NO Has patient had a PCN reaction occurring within the last 10 years:NO If all of the above answers are "NO", then may proceed with Cephalosporin use.      Medication List        Accurate as of 12/30/17 10:27 PM. Always use your most recent med list.          diclofenac sodium 1 % Gel Commonly known as:  VOLTAREN Apply 1-2 nickel size amount of Gel onto the affected area up to 3 times a day.   fluticasone 50 MCG/ACT nasal spray Commonly known as:  FLONASE Place 2 sprays into both nostrils daily.   gabapentin 300 MG capsule Commonly known as:  NEURONTIN Take 4 capsules (1,200 mg total) by mouth at bedtime. Take 1 PO x 3 days then 2 PO x 3 days then 3 PO x 3 days and then 4 PO QHS.   glycopyrrolate 2 MG tablet Commonly known as:  ROBINUL Take 1 tablet (2 mg total) by mouth 2 (two) times daily as needed.   levocetirizine 5 MG tablet Commonly known as:  XYZAL Take 1 tablet (5 mg total) by mouth every evening.   levothyroxine 75 MCG tablet Commonly known as:  SYNTHROID, LEVOTHROID Take 1 tablet (75 mcg total) by mouth daily before breakfast.   pantoprazole 40 MG tablet Commonly known as:  PROTONIX Take 1 tablet (40 mg total) by mouth daily before breakfast.        Follow-up: Return in about 6 months (around 07/01/2018).  Claretta Fraise, M.D.

## 2017-12-31 LAB — CBC WITH DIFFERENTIAL/PLATELET
BASOS: 0 %
Basophils Absolute: 0 10*3/uL (ref 0.0–0.2)
EOS (ABSOLUTE): 0.2 10*3/uL (ref 0.0–0.4)
EOS: 2 %
Hematocrit: 40.3 % (ref 34.0–46.6)
Hemoglobin: 13.6 g/dL (ref 11.1–15.9)
IMMATURE GRANS (ABS): 0 10*3/uL (ref 0.0–0.1)
IMMATURE GRANULOCYTES: 0 %
LYMPHS: 28 %
Lymphocytes Absolute: 2.6 10*3/uL (ref 0.7–3.1)
MCH: 29.1 pg (ref 26.6–33.0)
MCHC: 33.7 g/dL (ref 31.5–35.7)
MCV: 86 fL (ref 79–97)
Monocytes Absolute: 0.6 10*3/uL (ref 0.1–0.9)
Monocytes: 6 %
NEUTROS PCT: 64 %
Neutrophils Absolute: 5.8 10*3/uL (ref 1.4–7.0)
Platelets: 240 10*3/uL (ref 150–379)
RBC: 4.67 x10E6/uL (ref 3.77–5.28)
RDW: 16 % — ABNORMAL HIGH (ref 12.3–15.4)
WBC: 9.2 10*3/uL (ref 3.4–10.8)

## 2017-12-31 LAB — TSH: TSH: 2.07 u[IU]/mL (ref 0.450–4.500)

## 2017-12-31 LAB — CMP14+EGFR
ALK PHOS: 101 IU/L (ref 39–117)
ALT: 10 IU/L (ref 0–32)
AST: 13 IU/L (ref 0–40)
Albumin/Globulin Ratio: 1.8 (ref 1.2–2.2)
Albumin: 4.2 g/dL (ref 3.5–5.5)
BUN/Creatinine Ratio: 17 (ref 9–23)
BUN: 13 mg/dL (ref 6–24)
Bilirubin Total: 0.3 mg/dL (ref 0.0–1.2)
CO2: 24 mmol/L (ref 20–29)
Calcium: 9.2 mg/dL (ref 8.7–10.2)
Chloride: 99 mmol/L (ref 96–106)
Creatinine, Ser: 0.78 mg/dL (ref 0.57–1.00)
GFR calc Af Amer: 103 mL/min/{1.73_m2} (ref >59)
GFR calc non Af Amer: 89 mL/min/{1.73_m2} (ref >59)
GLUCOSE: 112 mg/dL — AB (ref 65–99)
Globulin, Total: 2.3 g/dL (ref 1.5–4.5)
Potassium: 4.2 mmol/L (ref 3.5–5.2)
Sodium: 140 mmol/L (ref 134–144)
Total Protein: 6.5 g/dL (ref 6.0–8.5)

## 2017-12-31 LAB — T4, FREE: Free T4: 1.04 ng/dL (ref 0.82–1.77)

## 2018-01-10 DIAGNOSIS — E669 Obesity, unspecified: Secondary | ICD-10-CM | POA: Diagnosis not present

## 2018-01-14 ENCOUNTER — Ambulatory Visit: Payer: 59 | Admitting: Allergy and Immunology

## 2018-02-14 ENCOUNTER — Ambulatory Visit (INDEPENDENT_AMBULATORY_CARE_PROVIDER_SITE_OTHER): Payer: 59 | Admitting: Family Medicine

## 2018-02-14 ENCOUNTER — Encounter: Payer: Self-pay | Admitting: Family Medicine

## 2018-02-14 ENCOUNTER — Other Ambulatory Visit: Payer: Self-pay | Admitting: Family Medicine

## 2018-02-14 ENCOUNTER — Telehealth: Payer: Self-pay | Admitting: Family Medicine

## 2018-02-14 ENCOUNTER — Encounter: Payer: Self-pay | Admitting: *Deleted

## 2018-02-14 VITALS — BP 106/67 | HR 70 | Temp 97.8°F | Ht 61.0 in | Wt 197.0 lb

## 2018-02-14 DIAGNOSIS — H66002 Acute suppurative otitis media without spontaneous rupture of ear drum, left ear: Secondary | ICD-10-CM

## 2018-02-14 DIAGNOSIS — R509 Fever, unspecified: Secondary | ICD-10-CM

## 2018-02-14 LAB — RAPID STREP SCREEN (MED CTR MEBANE ONLY): Strep Gp A Ag, IA W/Reflex: NEGATIVE

## 2018-02-14 LAB — VERITOR FLU A/B WAIVED
Influenza A: NEGATIVE
Influenza B: NEGATIVE

## 2018-02-14 LAB — CULTURE, GROUP A STREP

## 2018-02-14 MED ORDER — HYDROCODONE-HOMATROPINE 5-1.5 MG/5ML PO SYRP: 5.0000 mL | ORAL_SOLUTION | Freq: Four times a day (QID) | ORAL | 0 refills | Status: DC | PRN

## 2018-02-14 MED ORDER — AZITHROMYCIN 250 MG PO TABS: ORAL_TABLET | ORAL | 0 refills | Status: DC

## 2018-02-14 NOTE — Telephone Encounter (Signed)
[facility]

## 2018-02-14 NOTE — Telephone Encounter (Signed)
I am pretty sure I already sent this can we call the pharmacy to double check on this

## 2018-02-14 NOTE — Telephone Encounter (Signed)
Pharm states RX ready for pick up = dup rx denied

## 2018-02-14 NOTE — Progress Notes (Signed)
BP 106/67 (BP Location: Left Arm)   Pulse 70   Temp 97.8 F (36.6 C) (Oral)   Ht 5\' 1"  (1.549 m)   Wt 197 lb (89.4 kg)   BMI 37.22 kg/m    Subjective:    Patient ID: Kelli Allison, female    DOB: 23-Oct-1965, 52 y.o.   MRN: [id number]  HPI: Kelli Allison is a 52 y.o. female presenting on 02/14/2018 for Cough (ongestion - yellow); achey (HA); and Sore Throat (fever at night )   HPI Cough congestion sore throat and ear pain Cough and congestion and sore throat and ear pain that have been going on for the past 3 days.  She says she initially started with a sore throat and left ear pain that developed a cough and congestion and having fevers at night.  She does not have a thermometer so she did not take her temperature. She has been using tylenol and lozenges and nyquil.  She denies any shortness of breath or wheezing.  Her left ear and left side of her throat are the ones that are hurting the most and she feels like it is worsening over the past couple days.  She has so much coughing that it hurts on the lower part of her ribs.  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 Interim medical history since our last visit reviewed. Allergies and medications reviewed and updated.  Review of Systems  Constitutional: Negative for chills and fever.  HENT: Positive for congestion, ear pain, postnasal drip, rhinorrhea, sinus pressure, sneezing and sore throat. Negative for ear discharge.   Eyes: Negative for pain, redness and visual disturbance.  Respiratory: Positive for cough. Negative for chest tightness, shortness of breath and wheezing.   Cardiovascular: Negative for chest pain and leg swelling.  Genitourinary: Negative for difficulty urinating and dysuria.  Musculoskeletal: Negative for back pain and gait problem.  Skin: Negative for rash.   Neurological: Negative for light-headedness and headaches.  Psychiatric/Behavioral: Negative for agitation and behavioral problems.  All other systems reviewed and are negative.   Per HPI unless specifically indicated above   Allergies as of 02/14/2018      Reactions   Aspirin Nausea Only   Caffeine Other (See Comments)   Or the color in sodas-causes GI problems   Ibuprofen    Stomach ulcers   Penicillins Hives   Has patient had a PCN reaction causing immediate rash, facial/tongue/throat swelling, SOB or lightheadedness with hypotension: YES Has patient had a PCN reaction causing severe rash involving mucus membranes or skin necrosis: NO Has patient had a PCN reaction that required hospitalization NO Has patient had a PCN reaction occurring within the last 10 years:NO If all of the above answers are "NO", then may proceed with Cephalosporin use.      Medication List        Accurate as of 02/14/18  9:49 AM. Always use your most recent med list.          azithromycin 250 MG tablet Commonly known as:  ZITHROMAX Take 2 the first day and then one each day after.   diclofenac sodium 1 % Gel Commonly known as:  VOLTAREN Apply 1-2 nickel size amount of Gel onto the affected area up to 3 times a day.   fluticasone 50 MCG/ACT nasal spray Commonly known as:  FLONASE  Place 2 sprays into both nostrils daily.   gabapentin 300 MG capsule Commonly known as:  NEURONTIN Take 4 capsules (1,200 mg total) by mouth at bedtime. Take 1 PO x 3 days then 2 PO x 3 days then 3 PO x 3 days and then 4 PO QHS.   glycopyrrolate 2 MG tablet Commonly known as:  ROBINUL Take 1 tablet (2 mg total) by mouth 2 (two) times daily as needed.   levocetirizine 5 MG tablet Commonly known as:  XYZAL Take 1 tablet (5 mg total) by mouth every evening.   levothyroxine 75 MCG tablet Commonly known as:  SYNTHROID, LEVOTHROID Take 1 tablet (75 mcg total) by mouth daily before breakfast.   pantoprazole 40 MG  tablet Commonly known as:  PROTONIX Take 1 tablet (40 mg total) by mouth daily before breakfast.          Objective:    BP 106/67 (BP Location: Left Arm)   Pulse 70   Temp 97.8 F (36.6 C) (Oral)   Ht 5\' 1"  (1.549 m)   Wt 197 lb (89.4 kg)   BMI 37.22 kg/m   Wt Readings from Last 3 Encounters:  02/14/18 197 lb (89.4 kg)  12/30/17 200 lb 2 oz (90.8 kg)  11/07/17 196 lb 6.4 oz (89.1 kg)    Physical Exam  Constitutional: She is oriented to person, place, and time. She appears well-developed and well-nourished. No distress.  HENT:  Right Ear: Tympanic membrane, external ear and ear canal normal.  Left Ear: External ear and ear canal normal. There is swelling and tenderness. A middle ear effusion is present.  Nose: Mucosal edema and rhinorrhea present. No epistaxis. Right sinus exhibits no maxillary sinus tenderness and no frontal sinus tenderness. Left sinus exhibits no maxillary sinus tenderness and no frontal sinus tenderness.  Mouth/Throat: Uvula is midline and mucous membranes are normal. Posterior oropharyngeal edema and posterior oropharyngeal erythema present. No oropharyngeal exudate or tonsillar abscesses.  Eyes: Conjunctivae and EOM are normal.  Cardiovascular: Normal rate, regular rhythm, normal heart sounds and intact distal pulses.  No murmur heard. Pulmonary/Chest: Effort normal and breath sounds normal. No respiratory distress. She has no wheezes.  Musculoskeletal: Normal range of motion. She exhibits no edema or tenderness.  Neurological: She is alert and oriented to person, place, and time. Coordination normal.  Skin: Skin is warm and dry. No rash noted. She is not diaphoretic.  Psychiatric: She has a normal mood and affect. Her behavior is normal.  Vitals reviewed.   Strep and flu negative    Assessment & Plan:   Problem List Items Addressed This Visit    None    Visit Diagnoses    Acute suppurative otitis media of left ear without spontaneous rupture of  tympanic membrane, recurrence not specified    -  Primary   Relevant Medications   azithromycin (ZITHROMAX) 250 MG tablet   HYDROcodone-homatropine (HYCODAN) 5-1.5 MG/5ML syrup   Fever, unspecified fever cause       Relevant Medications   azithromycin (ZITHROMAX) 250 MG tablet   HYDROcodone-homatropine (HYCODAN) 5-1.5 MG/5ML syrup   Other Relevant Orders   Veritor Flu A/B Waived   Rapid Strep Screen ([facility] & [facility] ONLY)     Follow up plan: Return if symptoms worsen or fail to improve.  Counseling provided for all of the vaccine components Orders Placed This Encounter  Procedures  . Rapid Strep Screen ([facility] & [facility] ONLY)  . Veritor Flu A/B Reynolds Bowl,  MD [facility] 02/14/2018, 9:49 AM

## 2018-02-15 ENCOUNTER — Telehealth: Payer: Self-pay | Admitting: Family Medicine

## 2018-02-15 NOTE — Telephone Encounter (Signed)
[facility] called [facility] yesterday afternoon [facility] so [facility] and see why [facility] do not [facility] it because it should be there already

## 2018-02-15 NOTE — Telephone Encounter (Signed)
Patient aware cough syrup was [facility] yesterday 6/7 afternoon

## 2018-02-25 ENCOUNTER — Ambulatory Visit: Payer: 59 | Admitting: Allergy and Immunology

## 2018-03-21 DIAGNOSIS — E669 Obesity, unspecified: Secondary | ICD-10-CM | POA: Diagnosis not present

## 2018-05-12 DIAGNOSIS — S61211A Laceration without foreign body of left index finger without damage to nail, initial encounter: Secondary | ICD-10-CM | POA: Diagnosis not present

## 2018-05-12 DIAGNOSIS — X58XXXA Exposure to other specified factors, initial encounter: Secondary | ICD-10-CM | POA: Diagnosis not present

## 2018-05-12 DIAGNOSIS — S61201A Unspecified open wound of left index finger without damage to nail, initial encounter: Secondary | ICD-10-CM | POA: Diagnosis not present

## 2018-05-24 ENCOUNTER — Telehealth: Payer: Self-pay

## 2018-05-24 DIAGNOSIS — R52 Pain, unspecified: Secondary | ICD-10-CM | POA: Diagnosis not present

## 2018-05-24 DIAGNOSIS — R079 Chest pain, unspecified: Secondary | ICD-10-CM | POA: Diagnosis not present

## 2018-05-24 DIAGNOSIS — K529 Noninfective gastroenteritis and colitis, unspecified: Secondary | ICD-10-CM | POA: Diagnosis not present

## 2018-05-24 DIAGNOSIS — Z87891 Personal history of nicotine dependence: Secondary | ICD-10-CM | POA: Diagnosis not present

## 2018-05-24 DIAGNOSIS — R0789 Other chest pain: Secondary | ICD-10-CM | POA: Diagnosis not present

## 2018-05-24 DIAGNOSIS — R072 Precordial pain: Secondary | ICD-10-CM | POA: Diagnosis not present

## 2018-05-24 DIAGNOSIS — R1111 Vomiting without nausea: Secondary | ICD-10-CM | POA: Diagnosis not present

## 2018-05-24 DIAGNOSIS — R1084 Generalized abdominal pain: Secondary | ICD-10-CM | POA: Diagnosis not present

## 2018-05-24 NOTE — Telephone Encounter (Signed)
Patient called stating that EMS just left her house due to chest pain. States they did a EKG and states it was fine and it was not her heart that it was muscular pain. Patient c/o nausea, diarrhea, continuous chest pain at a 10 at the time of the phone call and SOB. Advised patient that she needed to go to the emergency room. Patient voiced understanding.

## 2018-05-26 ENCOUNTER — Ambulatory Visit (INDEPENDENT_AMBULATORY_CARE_PROVIDER_SITE_OTHER): Payer: 59 | Admitting: Nurse Practitioner

## 2018-05-26 ENCOUNTER — Encounter: Payer: Self-pay | Admitting: Nurse Practitioner

## 2018-05-26 VITALS — BP 118/78 | HR 73 | Temp 97.3°F | Ht 61.0 in | Wt 184.0 lb

## 2018-05-26 DIAGNOSIS — M940 Chondrocostal junction syndrome [Tietze]: Secondary | ICD-10-CM

## 2018-05-26 MED ORDER — CYCLOBENZAPRINE HCL 10 MG PO TABS: 10.0000 mg | ORAL_TABLET | Freq: Three times a day (TID) | ORAL | 1 refills | Status: DC | PRN

## 2018-05-26 MED ORDER — HYDROCODONE-ACETAMINOPHEN 5-325 MG PO TABS: 1.0000 | ORAL_TABLET | Freq: Four times a day (QID) | ORAL | 0 refills | Status: AC | PRN

## 2018-05-26 NOTE — Patient Instructions (Signed)
Costochondritis Costochondritis is swelling and irritation (inflammation) of the tissue (cartilage) that connects your ribs to your breastbone (sternum). This causes pain in the front of your chest. Usually, the pain:  Starts gradually.  Is in more than one rib.  This condition usually goes away on its own over time. Follow these instructions at home:  Do not do anything that makes your pain worse.  If directed, put ice on the painful area: ? Put ice in a plastic bag. ? Place a towel between your skin and the bag. ? Leave the ice on for 20 minutes, 2-3 times a day.  If directed, put heat on the affected area as often as told by your doctor. Use the heat source that your doctor tells you to use, such as a moist heat pack or a heating pad. ? Place a towel between your skin and the heat source. ? Leave the heat on for 20-30 minutes. ? Take off the heat if your skin turns bright red. This is very important if you cannot feel pain, heat, or cold. You may have a greater risk of getting burned.  Take over-the-counter and prescription medicines only as told by your doctor.  Return to your normal activities as told by your doctor. Ask your doctor what activities are safe for you.  Keep all follow-up visits as told by your doctor. This is important. Contact a doctor if:  You have chills or a fever.  Your pain does not go away or it gets worse.  You have a cough that does not go away. Get help right away if:  You are short of breath. This information is not intended to replace advice given to you by your health care provider. Make sure you discuss any questions you have with your health care provider. Document Released: 02/13/2008 Document Revised: 03/16/2016 Document Reviewed: 12/21/2015 Elsevier Interactive Patient Education  2018 Elsevier Inc.  

## 2018-05-26 NOTE — Progress Notes (Signed)
   Subjective:    Patient ID: Kelli ChannelKennie H Torbert, female    DOB: Jan 14, 1966, 52 y.o.   MRN: [id number]   Chief Complaint: Hospitalization Follow-up   HPI Patient went to ER at [facility] with c/o chest pain. EKG, chest xray and labs were all negative. Several days before she had vomiting and diarrhea. That resolveed then chest pain started. They felt that she may have pulled chest muscle from vomiting. She says she is still having pain and she can't even wear a bra because it is so uncomfortable. She also says when she eats she gets bad reflux. She was given prednisone and ultram in er which she says is not helping.   Review of Systems  Constitutional: Negative for activity change and appetite change.  HENT: Negative.   Eyes: Negative for pain.  Respiratory: Negative for shortness of breath.   Cardiovascular: Negative for chest pain, palpitations and leg swelling.  Gastrointestinal: Negative for abdominal pain.  Endocrine: Negative for polydipsia.  Genitourinary: Negative.   Skin: Negative for rash.  Neurological: Negative for dizziness, weakness and headaches.  Hematological: Does not bruise/bleed easily.  Psychiatric/Behavioral: Negative.   All other systems reviewed and are negative.      Objective:   Physical Exam  Constitutional: She is oriented to person, place, and time. She appears well-developed and well-nourished. She appears distressed (mild).  Cardiovascular: Normal rate.  Pulmonary/Chest: Effort normal. She exhibits tenderness (on palpation).  Neurological: She is alert and oriented to person, place, and time.  Skin: Skin is warm.  Psychiatric: She has a normal mood and affect. Her behavior is normal. Thought content normal.   BP 118/78   Pulse 73   Temp (!) 97.3 F (36.3 C) (Oral)   Ht 5\' 1"  (1.549 m)   Wt 184 lb (83.5 kg)   BMI 34.77 kg/m         Assessment & Plan:  Kelli ChannelKennie H Cacioppo in today with chief complaint of Hospitalization Follow-up   1.  Costochondritis Stop ultram Moist heat Rest Continue prednisone as rx RTO prn  Meds ordered this encounter  Medications  . HYDROcodone-acetaminophen (LORTAB) 5-325 MG tablet    Sig: Take 1 tablet by mouth every 6 (six) hours as needed for up to 5 days for moderate pain.    Dispense:  15 tablet    Refill:  0    Order Specific Question:   Supervising Provider    Answer:   VINCENT, CAROL L [[id number]]  . cyclobenzaprine (FLEXERIL) 10 MG tablet    Sig: Take 1 tablet (10 mg total) by mouth 3 (three) times daily as needed for muscle spasms.    Dispense:  30 tablet    Refill:  1    Order Specific Question:   Supervising Provider    Answer:   Johna SheriffVINCENT, CAROL L [[id number]]    Mary-Margaret Daphine DeutscherMartin, FNP

## 2018-07-02 ENCOUNTER — Ambulatory Visit: Payer: 59 | Admitting: Family Medicine

## 2018-07-09 ENCOUNTER — Encounter: Payer: Self-pay | Admitting: *Deleted

## 2018-07-09 ENCOUNTER — Encounter: Payer: Self-pay | Admitting: Pediatrics

## 2018-07-09 ENCOUNTER — Ambulatory Visit (INDEPENDENT_AMBULATORY_CARE_PROVIDER_SITE_OTHER): Payer: 59 | Admitting: Pediatrics

## 2018-07-09 VITALS — BP 107/77 | HR 89 | Temp 97.8°F | Ht 61.0 in | Wt 175.0 lb

## 2018-07-09 DIAGNOSIS — H6690 Otitis media, unspecified, unspecified ear: Secondary | ICD-10-CM

## 2018-07-09 DIAGNOSIS — K529 Noninfective gastroenteritis and colitis, unspecified: Secondary | ICD-10-CM | POA: Diagnosis not present

## 2018-07-09 MED ORDER — ONDANSETRON 4 MG PO TBDP: 4.0000 mg | ORAL_TABLET | Freq: Three times a day (TID) | ORAL | 0 refills | Status: DC | PRN

## 2018-07-09 MED ORDER — SUCRALFATE 1 GM/10ML PO SUSP: 1.0000 g | Freq: Three times a day (TID) | ORAL | 0 refills | Status: DC

## 2018-07-09 MED ORDER — AZITHROMYCIN 250 MG PO TABS: ORAL_TABLET | ORAL | 0 refills | Status: DC

## 2018-07-09 NOTE — Progress Notes (Signed)
  Subjective:   Patient ID: Kelli Allison, female    DOB: 1966-08-03, 52 y.o.   MRN: [id number] CC: Emesis and Diarrhea  HPI: Kelli Allison is a 52 y.o. female   Vomiting daily x 4 days, diarrhea daily, 3-4 times last night. Vomiting 6 times today so far.  Diarrhea has been getting better. Several people at work with the flu or stomach bug. Has had some pink tinged emesis, not eating anything pink or red. Eating chicken broth, chicken noodle soup. Ate solid food yesterday for first time, hamburger, ate a third then two hours later with burning in epigastric area. No fever.   Relevant past medical, surgical, family and social history reviewed. Allergies and medications reviewed and updated. Social History   Tobacco Use  Smoking Status Never Smoker  Smokeless Tobacco Never Used   ROS: Per HPI   Objective:    BP 107/77   Pulse 89   Temp 97.8 F (36.6 C) (Oral)   Ht 5\' 1"  (1.549 m)   Wt 175 lb (79.4 kg)   BMI 33.07 kg/m   Wt Readings from Last 3 Encounters:  07/09/18 175 lb (79.4 kg)  05/26/18 184 lb (83.5 kg)  02/14/18 197 lb (89.4 kg)    Gen: NAD, alert, cooperative with exam, NCAT EYES: EOMI, no conjunctival injection, or no icterus ENT:  TMs red with effusion b/l, OP without erythema LYMPH: no cervical LAD CV: NRRR, normal S1/S2, no murmur, distal pulses 2+ b/l Resp: CTABL, no wheezes, normal WOB Abd: +BS, soft, mildly ttp throughout, ND. no guarding or organomegaly Ext: No edema, warm Neuro: Alert and oriented, strength equal b/l UE and LE, coordination grossly normal MSK: normal muscle bulk  Assessment & Plan:  Kelli Allison was seen today for emesis and diarrhea.  Diagnoses and all orders for this visit:  Acute otitis media, unspecified otitis media type Treat with below, symptom care discussed -     azithromycin (ZITHROMAX) 250 MG tablet; Take 2 the first day and then one each day after.  Gastroenteritis Start below, if not improving, for any worsening let us  know. Return precautions discussed. -     ondansetron (ZOFRAN-ODT) 4 MG disintegrating tablet; Take 1 tablet (4 mg total) by mouth every 8 (eight) hours as needed for nausea or vomiting. -     sucralfate (CARAFATE) 1 GM/10ML suspension; Take 10 mLs (1 g total) by mouth 4 (four) times daily -  with meals and at bedtime.   Follow up plan: Return in about 1 week (around 07/16/2018). Rex Kras, MD [facility]

## 2018-07-21 ENCOUNTER — Ambulatory Visit (INDEPENDENT_AMBULATORY_CARE_PROVIDER_SITE_OTHER): Payer: 59 | Admitting: Family Medicine

## 2018-07-21 ENCOUNTER — Encounter: Payer: Self-pay | Admitting: Family Medicine

## 2018-07-21 VITALS — BP 101/75 | HR 94 | Temp 97.0°F | Ht 61.0 in | Wt 174.4 lb

## 2018-07-21 DIAGNOSIS — H66002 Acute suppurative otitis media without spontaneous rupture of ear drum, left ear: Secondary | ICD-10-CM | POA: Diagnosis not present

## 2018-07-21 DIAGNOSIS — E039 Hypothyroidism, unspecified: Secondary | ICD-10-CM

## 2018-07-21 DIAGNOSIS — R110 Nausea: Secondary | ICD-10-CM

## 2018-07-21 DIAGNOSIS — R1084 Generalized abdominal pain: Secondary | ICD-10-CM | POA: Diagnosis not present

## 2018-07-21 DIAGNOSIS — R42 Dizziness and giddiness: Secondary | ICD-10-CM

## 2018-07-21 DIAGNOSIS — K529 Noninfective gastroenteritis and colitis, unspecified: Secondary | ICD-10-CM

## 2018-07-21 MED ORDER — BETAMETHASONE SOD PHOS & ACET 6 (3-3) MG/ML IJ SUSP
6.0000 mg | Freq: Once | INTRAMUSCULAR | Status: AC
  Administered 2018-07-21: 6 mg via INTRAMUSCULAR

## 2018-07-21 MED ORDER — LEVOFLOXACIN 500 MG PO TABS: 500.0000 mg | ORAL_TABLET | Freq: Every day | ORAL | 0 refills | Status: DC

## 2018-07-21 MED ORDER — ONDANSETRON 8 MG PO TBDP: 8.0000 mg | ORAL_TABLET | Freq: Three times a day (TID) | ORAL | 1 refills | Status: DC | PRN

## 2018-07-21 NOTE — Progress Notes (Signed)
Subjective:  Patient ID: Kelli Allison, female    DOB: 18-Jan-1966  Age: 52 y.o. MRN: [id number]  CC: Medical Management of Chronic Issues and Rash (burn itching )   HPI Kelli Allison presents for patient presents for follow-up on  thyroid. The patient has a history of hypothyroidism for many years. It has been stable recently. Pt. denies any change in  voice, loss of hair, heat or cold intolerance. Energy level has been adequate to good. Patient denies constipation and diarrhea. No myxedema. Medication is as noted below. Verified that pt is taking it daily on an empty stomach. Well tolerated.  Patient also notes that for the last 2 weeks she has had an earache.  The Z-Pak just does not seem to have helped.  In fact since then her left ear pain is worsened and she feels that she is off balance.  She cannot walk straight.  She is okay when she lays down.  She also has had some chills at night her appetite is decreased and she is developed a rash.  The rash is pruritic gets primarily noted on the forearms and antecubital fossa as well as the left side of the neck.  Depression screen [facility] 2/9 07/21/2018 07/21/2018 07/09/2018  Decreased Interest 0 0 0  Down, Depressed, Hopeless 0 0 0  PHQ - 2 Score 0 0 0  Altered sleeping - - -  Tired, decreased energy - - -  Change in appetite - - -  Feeling bad or failure about yourself  - - -  Trouble concentrating - - -  Moving slowly or fidgety/restless - - -  Suicidal thoughts - - -  PHQ-9 Score - - -    History Kelli Allison has a past medical history of Hearing loss, Hypothyroidism, IBS (irritable bowel syndrome), Kidney stones, Migraine, Obesity, and Stomach ulcer due to nonsteroidal anti-inflammatory drug (NSAID).   She has a past surgical history that includes Inner ear surgery; Cesarean section; Cholecystectomy; and Tubal ligation.   Her family history includes Alcohol abuse in her mother; Asthma in her maternal grandfather and mother; Birth  defects in her brother and son; COPD in her maternal grandfather and mother; Cancer in her paternal grandmother; Depression in her brother, brother, daughter, and mother; Diabetes in her maternal grandmother; Drug abuse in her mother; Heart disease in her maternal grandmother; Hyperlipidemia in her maternal grandmother; Hypertension in her maternal grandmother; Kidney disease in her son; Learning disabilities in her brother and brother; Miscarriages / [place] in her daughter, maternal grandmother, and mother.She reports that she has never smoked. She has never used smokeless tobacco. She reports that she drinks alcohol. She reports that she does not use drugs.    ROS Review of Systems  Constitutional: Positive for appetite change and fatigue. Negative for fever.  HENT: Positive for ear pain. Negative for congestion.   Eyes: Negative for visual disturbance.  Respiratory: Negative for shortness of breath.   Cardiovascular: Negative for chest pain.  Gastrointestinal: Negative for abdominal pain, constipation, diarrhea, nausea and vomiting.  Genitourinary: Negative for difficulty urinating.  Musculoskeletal: Negative for arthralgias and myalgias.  Skin: Positive for rash.  Neurological: Positive for dizziness and light-headedness. Negative for headaches.  Psychiatric/Behavioral: Negative for sleep disturbance.    Objective:  BP 101/75 Comment: standing  Pulse 94   Temp (!) 97 F (36.1 C) (Oral)   Ht '5\' 1"'$  (1.549 m)   Wt 174 lb 6.4 oz (79.1 kg)   BMI 32.95 kg/m  BP Readings from Last 3 Encounters:  07/21/18 101/75  07/09/18 107/77  05/26/18 118/78    Wt Readings from Last 3 Encounters:  07/21/18 174 lb 6.4 oz (79.1 kg)  07/09/18 175 lb (79.4 kg)  05/26/18 184 lb (83.5 kg)     Physical Exam  Constitutional: She is oriented to person, place, and time. She appears well-developed and well-nourished. No distress.  HENT:  Head: Normocephalic and atraumatic.  Eyes: Pupils are  equal, round, and reactive to light. Conjunctivae are normal.  Neck: Normal range of motion. Neck supple. No thyromegaly present.  Cardiovascular: Normal rate, regular rhythm and normal heart sounds.  No murmur heard. Pulmonary/Chest: Effort normal and breath sounds normal. No respiratory distress. She has no wheezes. She has no rales.  Abdominal: Soft. Bowel sounds are normal. She exhibits no distension. There is no tenderness.  Musculoskeletal: Normal range of motion.  Lymphadenopathy:    She has no cervical adenopathy.  Neurological: She is alert and oriented to person, place, and time.  Skin: Skin is warm and dry. Rash (Maculopapular erythema at both antecubital fossa and some on the ventral left forearm as well as the postauricular left neck.) noted.  Psychiatric: She has a normal mood and affect. Her behavior is normal. Judgment and thought content normal.      Assessment & Plan:   Kelli Allison was seen today for medical management of chronic issues and rash.  Diagnoses and all orders for this visit:  Hypothyroidism, unspecified type -     CBC with Differential/Platelet -     CMP14+EGFR -     TSH + free T4  Acute suppurative otitis media of left ear without spontaneous rupture of tympanic membrane, recurrence not specified -     betamethasone acetate-betamethasone sodium phosphate (CELESTONE) injection 6 mg -     CBC with Differential/Platelet -     CMP14+EGFR  Nausea -     CBC with Differential/Platelet -     CMP14+EGFR  Generalized abdominal pain -     CBC with Differential/Platelet -     CMP14+EGFR  Gastroenteritis -     ondansetron (ZOFRAN-ODT) 8 MG disintegrating tablet; Take 1 tablet (8 mg total) by mouth 3 (three) times daily as needed for nausea or vomiting.  Disequilibrium  Other orders -     levofloxacin (LEVAQUIN) 500 MG tablet; Take 1 tablet (500 mg total) by mouth daily. For 10 days       I have discontinued Kelli Allison's azithromycin. I have  also changed her ondansetron. Additionally, I am having her start on levofloxacin. Lastly, I am having her maintain her pantoprazole, levocetirizine, levothyroxine, traMADol, cyclobenzaprine, and sucralfate. We administered betamethasone acetate-betamethasone sodium phosphate.  Allergies as of 07/21/2018      Reactions   Aspirin Nausea Only   Caffeine Other (See Comments)   Or the color in sodas-causes GI problems   Ibuprofen    Stomach ulcers   Penicillins Hives   Has patient had a PCN reaction causing immediate rash, facial/tongue/throat swelling, SOB or lightheadedness with hypotension: YES Has patient had a PCN reaction causing severe rash involving mucus membranes or skin necrosis: NO Has patient had a PCN reaction that required hospitalization NO Has patient had a PCN reaction occurring within the last 10 years:NO If all of the above answers are "NO", then may proceed with Cephalosporin use.      Medication List        Accurate as of 07/21/18  7:03 PM. Always use your  most recent med list.          cyclobenzaprine 10 MG tablet Commonly known as:  FLEXERIL Take 1 tablet (10 mg total) by mouth 3 (three) times daily as needed for muscle spasms.   levocetirizine 5 MG tablet Commonly known as:  XYZAL Take 1 tablet (5 mg total) by mouth every evening.   levofloxacin 500 MG tablet Commonly known as:  LEVAQUIN Take 1 tablet (500 mg total) by mouth daily. For 10 days   levothyroxine 75 MCG tablet Commonly known as:  SYNTHROID, LEVOTHROID Take 1 tablet (75 mcg total) by mouth daily before breakfast.   ondansetron 8 MG disintegrating tablet Commonly known as:  ZOFRAN-ODT Take 1 tablet (8 mg total) by mouth 3 (three) times daily as needed for nausea or vomiting.   pantoprazole 40 MG tablet Commonly known as:  PROTONIX Take 1 tablet (40 mg total) by mouth daily before breakfast.   sucralfate 1 GM/10ML suspension Commonly known as:  CARAFATE Take 10 mLs (1 g total) by mouth  4 (four) times daily -  with meals and at bedtime.   traMADol 50 MG tablet Commonly known as:  ULTRAM Take by mouth every 6 (six) hours as needed.        Follow-up: Return in about 2 weeks (around 08/04/2018), or if symptoms worsen or fail to improve.  Claretta Fraise, M.D.

## 2018-07-23 LAB — CBC WITH DIFFERENTIAL/PLATELET
Basophils Absolute: 0.1 10*3/uL (ref 0.0–0.2)
Basos: 1 %
EOS (ABSOLUTE): 0.2 10*3/uL (ref 0.0–0.4)
EOS: 2 %
HEMATOCRIT: 41.6 % (ref 34.0–46.6)
Hemoglobin: 13.3 g/dL (ref 11.1–15.9)
IMMATURE GRANS (ABS): 0 10*3/uL (ref 0.0–0.1)
Immature Granulocytes: 0 %
Lymphocytes Absolute: 3.3 10*3/uL — ABNORMAL HIGH (ref 0.7–3.1)
Lymphs: 35 %
MCH: 28.3 pg (ref 26.6–33.0)
MCHC: 32 g/dL (ref 31.5–35.7)
MCV: 89 fL (ref 79–97)
MONOS ABS: 0.5 10*3/uL (ref 0.1–0.9)
Monocytes: 6 %
Neutrophils Absolute: 5.3 10*3/uL (ref 1.4–7.0)
Neutrophils: 56 %
PLATELETS: 288 10*3/uL (ref 150–450)
RBC: 4.7 x10E6/uL (ref 3.77–5.28)
RDW: 13.9 % (ref 12.3–15.4)
WBC: 9.4 10*3/uL (ref 3.4–10.8)

## 2018-07-23 LAB — CMP14+EGFR
ALT: 8 IU/L (ref 0–32)
AST: 14 IU/L (ref 0–40)
Albumin/Globulin Ratio: 1.8 (ref 1.2–2.2)
Albumin: 4.8 g/dL (ref 3.5–5.5)
Alkaline Phosphatase: 100 IU/L (ref 39–117)
BUN/Creatinine Ratio: 10 (ref 9–23)
BUN: 9 mg/dL (ref 6–24)
Bilirubin Total: 0.3 mg/dL (ref 0.0–1.2)
CALCIUM: 9.5 mg/dL (ref 8.7–10.2)
CO2: 21 mmol/L (ref 20–29)
Chloride: 104 mmol/L (ref 96–106)
Creatinine, Ser: 0.9 mg/dL (ref 0.57–1.00)
GFR calc Af Amer: 86 mL/min/{1.73_m2} (ref >59)
GFR, EST NON AFRICAN AMERICAN: 75 mL/min/{1.73_m2} (ref >59)
GLOBULIN, TOTAL: 2.7 g/dL (ref 1.5–4.5)
Glucose: 83 mg/dL (ref 65–99)
Potassium: 3.8 mmol/L (ref 3.5–5.2)
Sodium: 143 mmol/L (ref 134–144)
Total Protein: 7.5 g/dL (ref 6.0–8.5)

## 2018-07-23 LAB — TSH+FREE T4
FREE T4: 1.1 ng/dL (ref 0.82–1.77)
TSH: 4.26 u[IU]/mL (ref 0.450–4.500)

## 2018-07-23 NOTE — Progress Notes (Signed)
Hello Kelli Allison,  Your lab result is normal.Some minor variations that are not significant are commonly marked abnormal, but do not represent any medical problem for you.  Best regards, Mechele ClaudeWarren Pebble Botkin, M.D.

## 2018-08-11 ENCOUNTER — Ambulatory Visit (INDEPENDENT_AMBULATORY_CARE_PROVIDER_SITE_OTHER): Payer: 59 | Admitting: Pediatrics

## 2018-08-11 ENCOUNTER — Encounter: Payer: Self-pay | Admitting: Pediatrics

## 2018-08-11 VITALS — BP 103/75 | HR 69 | Temp 97.1°F | Resp 18 | Ht 61.0 in | Wt 172.0 lb

## 2018-08-11 DIAGNOSIS — J069 Acute upper respiratory infection, unspecified: Secondary | ICD-10-CM

## 2018-08-11 DIAGNOSIS — R059 Cough, unspecified: Secondary | ICD-10-CM

## 2018-08-11 DIAGNOSIS — R6889 Other general symptoms and signs: Secondary | ICD-10-CM | POA: Diagnosis not present

## 2018-08-11 DIAGNOSIS — R05 Cough: Secondary | ICD-10-CM | POA: Diagnosis not present

## 2018-08-11 DIAGNOSIS — H7292 Unspecified perforation of tympanic membrane, left ear: Secondary | ICD-10-CM

## 2018-08-11 LAB — VERITOR FLU A/B WAIVED
Influenza A: NEGATIVE
Influenza B: NEGATIVE

## 2018-08-11 MED ORDER — GUAIFENESIN-CODEINE 100-10 MG/5ML PO SOLN: 5.0000 mL | Freq: Four times a day (QID) | ORAL | 0 refills | Status: DC | PRN

## 2018-08-11 MED ORDER — PANTOPRAZOLE SODIUM 40 MG PO TBEC: 40.0000 mg | DELAYED_RELEASE_TABLET | Freq: Every day | ORAL | 3 refills | Status: DC

## 2018-08-11 NOTE — Patient Instructions (Addendum)
Fever reducer and headache: tylenol   Sinus pressure:  Nasal steroid such as flonase/fluticaone or nasocort daily Can also take daily antihistamine such as loratadine/claritin or cetirizine/zyrtec Decongestant such as phenylephrine, but take in the morning or may keep you awake at night  Sinus rinses/irritation: Netipot or similar with distilled water 2-3 times a day to clear out sinuses or Normal saline nasal spray  Sore throat:  Throat lozenges chloroseptic spray  Stick with bland foods Drink lots of fluids

## 2018-08-11 NOTE — Progress Notes (Signed)
  Subjective:   Patient ID: Kelli Allison, female    DOB: March 22, 1966, 52 y.o.   MRN: [id number] CC: Chest congestion; Ear Pain; and Cough  HPI: Kelli ChannelKennie H Barsanti is a 52 y.o. female   Seen 4 weeks ago, treated with azithromycin for acute otitis media.  Seen 2 weeks ago, antibiotic switched to levofloxacin due to ongoing left ear pain.  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  Starting yesterday, she started feeling sick again.  Pressure in her sinuses, coughing at night, mostly dry.  She had a temperature yesterday to 101.8.  She took Tylenol last night and this morning at 8 AM.  4 days ago a cousin was in a car accident in a coma in the [place] Delford FieldWright was there up until yesterday.   She has had problems with her ears for years.  Last saw ear nose and throat doctor in 2017, says that he has since retired.  She wears hearing aids.  She had a tube in one of her ears, is not sure which one.  She has not noticed any drainage from either ear.  Relevant past medical, surgical, family and social history reviewed. Allergies and medications reviewed and updated. Social History   Tobacco Use  Smoking Status Never Smoker  Smokeless Tobacco Never Used   ROS: Per HPI   Objective:    BP 103/75   Pulse 69   Temp (!) 97.1 F (36.2 C) (Oral)   Resp 18   Ht 5\' 1"  (1.549 m)   Wt 172 lb (78 kg)   SpO2 100%   BMI 32.50 kg/m   Wt Readings from Last 3 Encounters:  08/11/18 172 lb (78 kg)  07/21/18 174 lb 6.4 oz (79.1 kg)  07/09/18 175 lb (79.4 kg)    Gen: NAD, alert, cooperative with exam, NCAT, congested EYES: EOMI, no conjunctival injection, or no icterus ENT: Right TM injected, slightly splayed LR with white effusion, left TM pink-red, anterior perforation present. OP without erythema LYMPH: no cervical LAD CV: NRRR, normal S1/S2, no murmur, distal pulses 2+  b/l Resp: CTABL, no wheezes, normal WOB Ext: No edema, warm Neuro: Alert and oriented, strength equal b/l UE and LE, coordination grossly normal MSK: normal muscle bulk  Assessment & Plan:  Carlyon ShadowKennie was seen today for chest congestion, ear pain and cough.  Diagnoses and all orders for this visit:  Acute URI Flu-like symptoms Continue Tylenol as needed.  Flu test negative today.  Discussed symptomatic care.  Suspect this is new illness, symptoms started yesterday.  Return precautions discussed. -     Veritor Flu A/B Waived  Perforated ear drum, left -     Ambulatory referral to ENT  Cough Take below as needed. -     guaiFENesin-codeine 100-10 MG/5ML syrup; Take 5-10 mLs by mouth every 6 (six) hours as needed for cough.  Other orders -     pantoprazole (PROTONIX) 40 MG tablet; Take 1 tablet (40 mg total) by mouth daily before breakfast.   Follow up plan: Return in about 4 weeks (around 09/08/2018). Rex Krasarol Vincent, MD [facility]

## 2018-12-03 ENCOUNTER — Other Ambulatory Visit: Payer: Self-pay | Admitting: Family Medicine

## 2018-12-09 ENCOUNTER — Telehealth: Payer: Self-pay

## 2018-12-09 ENCOUNTER — Other Ambulatory Visit: Payer: Self-pay

## 2018-12-09 MED ORDER — LEVOTHYROXINE SODIUM 75 MCG PO TABS: 75.0000 ug | ORAL_TABLET | Freq: Every day | ORAL | 1 refills | Status: AC

## 2018-12-09 NOTE — Telephone Encounter (Signed)
Pantoprazole was denied for prior auth because it is an excluded benefit

## 2018-12-09 NOTE — Telephone Encounter (Signed)
Can you tell if any others are covered? (Nexium, aciphex, prevacid, etc) Thanks, WS

## 2018-12-09 NOTE — Telephone Encounter (Signed)
No medication is covered for this under the plan

## 2018-12-09 NOTE — Telephone Encounter (Signed)
Called pt, she was transferred to [place] in prior auth.

## 2018-12-09 NOTE — Telephone Encounter (Signed)
Have her use OTC Nexium 2 pills a day

## 2019-08-24 ENCOUNTER — Encounter (HOSPITAL_BASED_OUTPATIENT_CLINIC_OR_DEPARTMENT_OTHER): Payer: Self-pay | Admitting: Emergency Medicine

## 2019-08-24 ENCOUNTER — Other Ambulatory Visit: Payer: Self-pay

## 2019-08-24 ENCOUNTER — Emergency Department (HOSPITAL_BASED_OUTPATIENT_CLINIC_OR_DEPARTMENT_OTHER): Payer: 59

## 2019-08-24 ENCOUNTER — Emergency Department (HOSPITAL_BASED_OUTPATIENT_CLINIC_OR_DEPARTMENT_OTHER)
Admission: EM | Admit: 2019-08-24 | Discharge: 2019-08-24 | Disposition: A | Discharge status: Home / Self Care | Payer: 59 | Attending: Emergency Medicine | Admitting: Emergency Medicine

## 2019-08-24 DIAGNOSIS — Z79899 Other long term (current) drug therapy: Secondary | ICD-10-CM | POA: Insufficient documentation

## 2019-08-24 DIAGNOSIS — R1031 Right lower quadrant pain: Secondary | ICD-10-CM | POA: Diagnosis present

## 2019-08-24 DIAGNOSIS — E039 Hypothyroidism, unspecified: Secondary | ICD-10-CM | POA: Diagnosis not present

## 2019-08-24 DIAGNOSIS — K2900 Acute gastritis without bleeding: Secondary | ICD-10-CM | POA: Diagnosis not present

## 2019-08-24 DIAGNOSIS — K529 Noninfective gastroenteritis and colitis, unspecified: Secondary | ICD-10-CM

## 2019-08-24 LAB — CBC WITH DIFFERENTIAL/PLATELET
Abs Immature Granulocytes: 0.05 10*3/uL (ref 0.00–0.07)
Basophils Absolute: 0 10*3/uL (ref 0.0–0.1)
Basophils Relative: 0 %
Eosinophils Absolute: 0.1 10*3/uL (ref 0.0–0.5)
Eosinophils Relative: 1 %
HCT: 45.5 % (ref 36.0–46.0)
Hemoglobin: 14.7 g/dL (ref 12.0–15.0)
Immature Granulocytes: 1 %
Lymphocytes Relative: 20 %
Lymphs Abs: 2.2 10*3/uL (ref 0.7–4.0)
MCH: 29.9 pg (ref 26.0–34.0)
MCHC: 32.3 g/dL (ref 30.0–36.0)
MCV: 92.7 fL (ref 80.0–100.0)
Monocytes Absolute: 0.6 10*3/uL (ref 0.1–1.0)
Monocytes Relative: 6 %
Neutro Abs: 8.1 10*3/uL — ABNORMAL HIGH (ref 1.7–7.7)
Neutrophils Relative %: 72 %
Platelets: 232 10*3/uL (ref 150–400)
RBC: 4.91 MIL/uL (ref 3.87–5.11)
RDW: 14 % (ref 11.5–15.5)
WBC: 11.1 10*3/uL — ABNORMAL HIGH (ref 4.0–10.5)
nRBC: 0 % (ref 0.0–0.2)

## 2019-08-24 LAB — URINALYSIS, ROUTINE W REFLEX MICROSCOPIC
Bilirubin Urine: NEGATIVE
Glucose, UA: NEGATIVE mg/dL
Hgb urine dipstick: NEGATIVE
Ketones, ur: NEGATIVE mg/dL
Leukocytes,Ua: NEGATIVE
Nitrite: NEGATIVE
Protein, ur: NEGATIVE mg/dL
Specific Gravity, Urine: >1.03 — ABNORMAL HIGH (ref 1.005–1.030)
pH: 5.5 (ref 5.0–8.0)

## 2019-08-24 LAB — COMPREHENSIVE METABOLIC PANEL
ALT: 12 U/L (ref 0–44)
AST: 18 U/L (ref 15–41)
Albumin: 4.5 g/dL (ref 3.5–5.0)
Alkaline Phosphatase: 86 U/L (ref 38–126)
Anion gap: 9 (ref 5–15)
BUN: 15 mg/dL (ref 6–20)
CO2: 25 mmol/L (ref 22–32)
Calcium: 9.6 mg/dL (ref 8.9–10.3)
Chloride: 103 mmol/L (ref 98–111)
Creatinine, Ser: 0.75 mg/dL (ref 0.44–1.00)
GFR calc Af Amer: >60 mL/min (ref >60)
GFR calc non Af Amer: >60 mL/min (ref >60)
Glucose, Bld: 103 mg/dL — ABNORMAL HIGH (ref 70–99)
Potassium: 4.2 mmol/L (ref 3.5–5.1)
Sodium: 137 mmol/L (ref 135–145)
Total Bilirubin: 0.7 mg/dL (ref 0.3–1.2)
Total Protein: 7.8 g/dL (ref 6.5–8.1)

## 2019-08-24 MED ORDER — PANTOPRAZOLE SODIUM 40 MG PO TBEC: 40.0000 mg | DELAYED_RELEASE_TABLET | Freq: Every day | ORAL | 3 refills | Status: DC

## 2019-08-24 MED ORDER — FENTANYL CITRATE (PF) 100 MCG/2ML IJ SOLN
50.0000 ug | Freq: Once | INTRAMUSCULAR | Status: AC
  Administered 2019-08-24: 50 ug via INTRAVENOUS
  Filled 2019-08-24: qty 2

## 2019-08-24 MED ORDER — DICYCLOMINE HCL 10 MG/ML IM SOLN
20.0000 mg | Freq: Once | INTRAMUSCULAR | Status: AC
  Administered 2019-08-24: 12:00:00 20 mg via INTRAMUSCULAR
  Filled 2019-08-24: qty 2

## 2019-08-24 MED ORDER — SODIUM CHLORIDE 0.9 % IV SOLN
INTRAVENOUS | Status: DC | PRN
  Administered 2019-08-24: 12:00:00 via INTRAVENOUS

## 2019-08-24 MED ORDER — SUCRALFATE 1 GM/10ML PO SUSP: 1.0000 g | Freq: Three times a day (TID) | ORAL | 0 refills | Status: DC

## 2019-08-24 MED ORDER — ONDANSETRON 4 MG PO TBDP: 4.0000 mg | ORAL_TABLET | Freq: Three times a day (TID) | ORAL | 0 refills | Status: DC | PRN

## 2019-08-24 MED ORDER — FAMOTIDINE IN NACL 20-0.9 MG/50ML-% IV SOLN
20.0000 mg | Freq: Once | INTRAVENOUS | Status: AC
  Administered 2019-08-24: 20 mg via INTRAVENOUS
  Filled 2019-08-24: qty 50

## 2019-08-24 MED FILL — PANTOPRAZOLE SOD DR 40 MG T: 40 | 90 days supply | Qty: 90 | Fill #0

## 2019-08-24 MED FILL — ONDANSETRON ODT 4 MG TABLET: 4 | 1 days supply | Qty: 4 | Fill #0

## 2019-08-24 MED FILL — SUCRALFATE 1 GM/10ML SUSP: 1 | 11 days supply | Qty: 420 | Fill #0

## 2019-08-24 NOTE — Discharge Instructions (Signed)
Take the medications to help with your symptoms. Return to the ED if you start to develop a fever, worsening pain, chest pain, shortness of breath or vomiting up blood.

## 2019-08-24 NOTE — ED Provider Notes (Signed)
[facility] EMERGENCY DEPARTMENT Provider Note   CSN: [id number] Arrival date & time: 08/24/19  0737     History Chief Complaint  Patient presents with  . Flank Pain    Kelli Allison is a 53 y.o. female with a past medical history of IBS, kidney stones, obesity, ulcers presenting to the ED with a chief complaint of right-sided flank pain.  4 days ago started having intermittent sharp right flank pain radiating to her right upper quadrant and right lower quadrant.  She is unsure if this will similar to her IBS flareups for her kidney stones.  States that her prior kidney stones have passed without difficulty.  She denies any dysuria, fever or hematuria.  She had 1 episode of nonbloody, nonbilious emesis yesterday.  Minimal improvement noted with Tylenol.  Denies any injuries or falls, chest pain or shortness of breath, changes to bowel movements.  HPI     Past Medical History:  Diagnosis Date  . Hearing loss   . Hypothyroidism   . IBS (irritable bowel syndrome)   . Kidney stones   . Migraine   . Obesity   . Stomach ulcer due to nonsteroidal anti-inflammatory drug (NSAID)     Patient Active Problem List   Diagnosis Date Noted  . Hypothyroidism 09/25/2017  . Obesity   . Nonintractable episodic headache 12/31/2016  . Allergic rhinitis 12/31/2016    Past Surgical History:  Procedure Laterality Date  . CESAREAN SECTION    . CHOLECYSTECTOMY    . INNER EAR SURGERY    . TUBAL LIGATION       OB History   No obstetric history on file.     Family History  Problem Relation Age of Onset  . Alcohol abuse Mother   . Asthma Mother   . COPD Mother   . Depression Mother   . Drug abuse Mother   . Miscarriages / [place] Mother   . Depression Brother   . Learning disabilities Brother   . Depression Daughter   . Miscarriages / [place] Daughter   . Birth defects Son        spinabifida, scoliosis  . Kidney disease Son   . Diabetes Maternal Grandmother     . Heart disease Maternal Grandmother   . Hyperlipidemia Maternal Grandmother   . Hypertension Maternal Grandmother   . Miscarriages / Stillbirths Maternal Grandmother   . Asthma Maternal Grandfather   . COPD Maternal Grandfather   . Cancer Paternal Grandmother   . Birth defects Brother        born with one kidney  . Depression Brother   . Learning disabilities Brother        dislexia    Social History   Tobacco Use  . Smoking status: Never Smoker  . Smokeless tobacco: Never Used  Substance Use Topics  . Alcohol use: Yes    Comment: couple times a week.  None in a month  . Drug use: No    Home Medications Prior to Admission medications   Medication Sig Start Date End Date Taking? Authorizing Provider  levothyroxine (SYNTHROID, LEVOTHROID) 75 MCG tablet Take 1 tablet (75 mcg total) by mouth daily before breakfast. 12/09/18  Yes Stacks, Cletus Gash, MD  Multiple Vitamin (MULTIVITAMIN) tablet Take 1 tablet by mouth daily.   Yes [provider]  ondansetron (ZOFRAN ODT) 4 MG disintegrating tablet Take 1 tablet (4 mg total) by mouth every 8 (eight) hours as needed for nausea or vomiting. 08/24/19   Shelly Coss,  Zooey Schreurs, PA-C  pantoprazole (PROTONIX) 40 MG tablet Take 1 tablet (40 mg total) by mouth daily before breakfast. 08/24/19   Hilari Wethington, PA-C  sucralfate (CARAFATE) 1 GM/10ML suspension Take 10 mLs (1 g total) by mouth 4 (four) times daily -  with meals and at bedtime. 08/24/19   Khaiden Segreto, PA-C    Allergies    Aspirin, Caffeine, Ibuprofen, and Penicillins  Review of Systems   Review of Systems  Constitutional: Negative for appetite change, chills and fever.  HENT: Negative for ear pain, rhinorrhea, sneezing and sore throat.   Eyes: Negative for photophobia and visual disturbance.  Respiratory: Negative for cough, chest tightness, shortness of breath and wheezing.   Cardiovascular: Negative for chest pain and palpitations.  Gastrointestinal: Positive for nausea and  vomiting. Negative for abdominal pain, blood in stool, constipation and diarrhea.  Genitourinary: Positive for flank pain. Negative for dysuria, hematuria and urgency.  Musculoskeletal: Negative for myalgias.  Skin: Negative for rash.  Neurological: Negative for dizziness, weakness and light-headedness.    Physical Exam Updated Vital Signs BP 114/86 (BP Location: Right Arm)   Pulse 75   Temp 98.4 F (36.9 C) (Oral)   Resp 16   Ht 5\' 1"  (1.549 m)   Wt 86.5 kg   SpO2 100%   BMI 36.03 kg/m   Physical Exam Vitals and nursing note reviewed.  Constitutional:      General: She is not in acute distress.    Appearance: She is well-developed.  HENT:     Head: Normocephalic and atraumatic.     Nose: Nose normal.  Eyes:     General: No scleral icterus.       Left eye: No discharge.     Conjunctiva/sclera: Conjunctivae normal.  Cardiovascular:     Rate and Rhythm: Normal rate and regular rhythm.     Heart sounds: Normal heart sounds. No murmur. No friction rub. No gallop.   Pulmonary:     Effort: Pulmonary effort is normal. No respiratory distress.     Breath sounds: Normal breath sounds.  Abdominal:     General: Bowel sounds are normal. There is no distension.     Palpations: Abdomen is soft.     Tenderness: There is abdominal tenderness. There is right CVA tenderness. There is no guarding.  Musculoskeletal:        General: Normal range of motion.     Cervical back: Normal range of motion and neck supple.  Skin:    General: Skin is warm and dry.     Findings: No rash.  Neurological:     Mental Status: She is alert.     Motor: No abnormal muscle tone.     Coordination: Coordination normal.     ED Results / Procedures / Treatments   Labs (all labs ordered are listed, but only abnormal results are displayed) Labs Reviewed  URINALYSIS, ROUTINE W REFLEX MICROSCOPIC - Abnormal; Notable for the following components:      Result Value   Specific Gravity, Urine >1.030 (*)     All other components within normal limits  COMPREHENSIVE METABOLIC PANEL - Abnormal; Notable for the following components:   Glucose, Bld 103 (*)    All other components within normal limits  CBC WITH DIFFERENTIAL/PLATELET - Abnormal; Notable for the following components:   WBC 11.1 (*)    Neutro Abs 8.1 (*)    All other components within normal limits  URINE CULTURE    EKG None  Radiology CT Renal  Stone Study  Result Date: 08/24/2019 CLINICAL DATA:  Right flank and lower rib pain for 4 days EXAM: CT ABDOMEN AND PELVIS WITHOUT CONTRAST TECHNIQUE: Multidetector CT imaging of the abdomen and pelvis was performed following the standard protocol without IV contrast. COMPARISON:  06/30/2019 FINDINGS: Lower chest: No acute abnormality. Hepatobiliary: No focal liver abnormality is seen. Status post cholecystectomy. Postoperative biliary dilatation. Pancreas: Unremarkable. No pancreatic ductal dilatation or surrounding inflammatory changes. Spleen: Normal in size without significant abnormality. Adrenals/Urinary Tract: Benign subcentimeter left adrenal adenoma. Kidneys are normal, without renal calculi, solid lesion, or hydronephrosis. Bladder is unremarkable. Stomach/Bowel: Stomach is within normal limits. Appendix appears normal. No evidence of bowel wall thickening, distention, or inflammatory changes. Vascular/Lymphatic: No significant vascular findings are present. No enlarged abdominal or pelvic lymph nodes. Reproductive: No mass or other significant abnormality. Other: No abdominal wall hernia or abnormality. No abdominopelvic ascites. Musculoskeletal: No acute or significant osseous findings. IMPRESSION: 1. No acute noncontrast CT findings of the abdomen or pelvis. 2. A previously described right renal calculus is not noted on today's examination and may have passed in the interval. No hydronephrosis. Electronically Signed   By: Lauralyn PrimesAlex  Bibbey M.D.   On: 08/24/2019 11:17    Procedures Procedures  (including critical care time)  Medications Ordered in ED Medications  0.9 %  sodium chloride infusion ( Intravenous New Bag/Given 08/24/19 1159)  fentaNYL (SUBLIMAZE) injection 50 mcg (50 mcg Intravenous Given 08/24/19 1112)  dicyclomine (BENTYL) injection 20 mg (20 mg Intramuscular Given 08/24/19 1201)  famotidine (PEPCID) IVPB 20 mg premix (20 mg Intravenous New Bag/Given 08/24/19 1200)    ED Course  I have reviewed the triage vital signs and the nursing notes.  Pertinent labs & imaging results that were available during my care of the patient were reviewed by me and considered in my medical decision making (see chart for details).    MDM Rules/Calculators/A&P                       53 year old female with a past medical history of IBS and kidney stones presenting to the ED with a chief complaint of right flank pain.  Reports associated vomiting.  Tenderness to palpation of the right flank area without rebound or guarding.  Work-up including CMP, urinalysis unremarkable.  CBC with mild leukocytosis of 11.  CT renal stone study shows no acute findings, possible passed stone since prior CT.  On reexam patient is overall well-appearing, given pain medication, Bentyl and Pepcid.  Repeat abdominal exams are benign.  Suspect that her symptoms could be a flareup of her IBS as she was concerned.  Doubt appendicitis, cholecystitis or other surgical emergent cause of symptoms.  We will have her follow-up with PCP and GI, give refills of her medications and return for worsening symptoms.  Patient is hemodynamically stable, in NAD, and able to ambulate in the ED. Evaluation does not show pathology that would require ongoing emergent intervention or inpatient treatment. I explained the diagnosis to the patient. Pain has been managed and has no complaints prior to discharge. Patient is comfortable with above plan and is stable for discharge at this time. All questions were answered prior to disposition.  Strict return precautions for returning to the ED were discussed. Encouraged follow up with PCP.   An After Visit Summary was printed and given to the patient.   Portions of this note were generated with Scientist, clinical (histocompatibility and immunogenetics)Dragon dictation software. Dictation errors may occur despite best attempts at proofreading.  Final Clinical Impression(s) / ED Diagnoses Final diagnoses:  Acute gastritis without hemorrhage, unspecified gastritis type    Rx / DC Orders ED Discharge Orders         Ordered    sucralfate (CARAFATE) 1 GM/10ML suspension  3 times daily with meals & bedtime     08/24/19 1240    pantoprazole (PROTONIX) 40 MG tablet  Daily before breakfast     08/24/19 1240    ondansetron (ZOFRAN ODT) 4 MG disintegrating tablet  Every 8 hours PRN     08/24/19 1240           Dietrich Pates, PA-C 08/24/19 1242    Little, Ambrose [place], MD 08/24/19 1348

## 2019-08-24 NOTE — ED Triage Notes (Signed)
Right flank and low right rib pain x4 days. Getting worse since yesterday.  Denies urinary sx.  Sts she has vomited.

## 2019-08-25 LAB — URINE CULTURE: Culture: <10000 — AB

## 2020-04-18 ENCOUNTER — Other Ambulatory Visit: Payer: Self-pay

## 2020-04-18 ENCOUNTER — Ambulatory Visit (INDEPENDENT_AMBULATORY_CARE_PROVIDER_SITE_OTHER): Payer: 59 | Admitting: Family Medicine

## 2020-04-18 ENCOUNTER — Encounter (INDEPENDENT_AMBULATORY_CARE_PROVIDER_SITE_OTHER): Payer: Self-pay | Admitting: Family Medicine

## 2020-04-18 VITALS — BP 98/60 | HR 65 | Temp 97.7°F | Ht 61.0 in | Wt 175.0 lb

## 2020-04-18 DIAGNOSIS — Z1331 Encounter for screening for depression: Secondary | ICD-10-CM

## 2020-04-18 DIAGNOSIS — Z9189 Other specified personal risk factors, not elsewhere classified: Secondary | ICD-10-CM | POA: Diagnosis not present

## 2020-04-18 DIAGNOSIS — E538 Deficiency of other specified B group vitamins: Secondary | ICD-10-CM | POA: Diagnosis not present

## 2020-04-18 DIAGNOSIS — R0602 Shortness of breath: Secondary | ICD-10-CM

## 2020-04-18 DIAGNOSIS — Z6833 Body mass index (BMI) 33.0-33.9, adult: Secondary | ICD-10-CM

## 2020-04-18 DIAGNOSIS — E038 Other specified hypothyroidism: Secondary | ICD-10-CM

## 2020-04-18 DIAGNOSIS — R5383 Other fatigue: Secondary | ICD-10-CM

## 2020-04-18 DIAGNOSIS — E669 Obesity, unspecified: Secondary | ICD-10-CM

## 2020-04-18 DIAGNOSIS — Z0289 Encounter for other administrative examinations: Secondary | ICD-10-CM

## 2020-04-19 LAB — T3: T3, Total: 114 ng/dL (ref 71–180)

## 2020-04-19 LAB — HEMOGLOBIN A1C
Est. average glucose Bld gHb Est-mCnc: 114 mg/dL
Hgb A1c MFr Bld: 5.6 % (ref 4.8–5.6)

## 2020-04-19 LAB — TSH: TSH: 0.586 u[IU]/mL (ref 0.450–4.500)

## 2020-04-19 LAB — T4: T4, Total: 7.7 ug/dL (ref 4.5–12.0)

## 2020-04-19 LAB — LIPID PANEL WITH LDL/HDL RATIO
Cholesterol, Total: 227 mg/dL — ABNORMAL HIGH (ref 100–199)
HDL: 72 mg/dL (ref >39)
LDL Chol Calc (NIH): 137 mg/dL — ABNORMAL HIGH (ref 0–99)
LDL/HDL Ratio: 1.9 ratio (ref 0.0–3.2)
Triglycerides: 101 mg/dL (ref 0–149)
VLDL Cholesterol Cal: 18 mg/dL (ref 5–40)

## 2020-04-19 LAB — CBC WITH DIFFERENTIAL/PLATELET
Basophils Absolute: 0.1 10*3/uL (ref 0.0–0.2)
Basos: 1 %
EOS (ABSOLUTE): 0.1 10*3/uL (ref 0.0–0.4)
Eos: 2 %
Hematocrit: 45.2 % (ref 34.0–46.6)
Hemoglobin: 14.5 g/dL (ref 11.1–15.9)
Immature Grans (Abs): 0 10*3/uL (ref 0.0–0.1)
Immature Granulocytes: 0 %
Lymphocytes Absolute: 2 10*3/uL (ref 0.7–3.1)
Lymphs: 32 %
MCH: 29.8 pg (ref 26.6–33.0)
MCHC: 32.1 g/dL (ref 31.5–35.7)
MCV: 93 fL (ref 79–97)
Monocytes Absolute: 0.4 10*3/uL (ref 0.1–0.9)
Monocytes: 6 %
Neutrophils Absolute: 3.6 10*3/uL (ref 1.4–7.0)
Neutrophils: 59 %
Platelets: 230 10*3/uL (ref 150–450)
RBC: 4.86 x10E6/uL (ref 3.77–5.28)
RDW: 13.1 % (ref 11.7–15.4)
WBC: 6.3 10*3/uL (ref 3.4–10.8)

## 2020-04-19 LAB — COMPREHENSIVE METABOLIC PANEL
ALT: 11 IU/L (ref 0–32)
AST: 10 IU/L (ref 0–40)
Albumin/Globulin Ratio: 1.7 (ref 1.2–2.2)
Albumin: 4.2 g/dL (ref 3.8–4.9)
Alkaline Phosphatase: 121 IU/L (ref 48–121)
BUN/Creatinine Ratio: 17 (ref 9–23)
BUN: 12 mg/dL (ref 6–24)
Bilirubin Total: 0.2 mg/dL (ref 0.0–1.2)
CO2: 25 mmol/L (ref 20–29)
Calcium: 9.3 mg/dL (ref 8.7–10.2)
Chloride: 103 mmol/L (ref 96–106)
Creatinine, Ser: 0.71 mg/dL (ref 0.57–1.00)
GFR calc Af Amer: 113 mL/min/{1.73_m2} (ref >59)
GFR calc non Af Amer: 98 mL/min/{1.73_m2} (ref >59)
Globulin, Total: 2.5 g/dL (ref 1.5–4.5)
Glucose: 102 mg/dL — ABNORMAL HIGH (ref 65–99)
Potassium: 4.5 mmol/L (ref 3.5–5.2)
Sodium: 142 mmol/L (ref 134–144)
Total Protein: 6.7 g/dL (ref 6.0–8.5)

## 2020-04-19 LAB — FOLATE: Folate: 6.5 ng/mL (ref >3.0)

## 2020-04-19 LAB — VITAMIN B12: Vitamin B-12: 730 pg/mL (ref 232–1245)

## 2020-04-19 LAB — VITAMIN D 25 HYDROXY (VIT D DEFICIENCY, FRACTURES): Vit D, 25-Hydroxy: 22.4 ng/mL — ABNORMAL LOW (ref 30.0–100.0)

## 2020-04-19 LAB — INSULIN, RANDOM: INSULIN: 26.1 u[IU]/mL — ABNORMAL HIGH (ref 2.6–24.9)

## 2020-04-20 NOTE — Progress Notes (Signed)
Chief Complaint:   OBESITY Kelli Allison (MR# [id number]) is a 54 y.o. female who presents for evaluation and treatment of obesity and related comorbidities. Current BMI is Body mass index is 33.07 kg/m. Kelli Allison has been struggling with her weight for many years and has been unsuccessful in either losing weight, maintaining weight loss, or reaching her healthy weight goal.  Kelli Allison was referred by Dr. Rennis Harding. She has a history of yo-yo meal plans and previously on phentermine. She often skips breakfast and occasionally dinner. Protein shake in the morning or egg sandwich (2 eggs), most of the time a pack of crackers with a diet green tea. 1130 am-130 pm is grilled chicken with green beans and mashed potatoes, or 3 slices of deli [place] with tomato and mayo (eats the whole sandiwch or 1/2 eating out). If she doesn't eat out at dinner she will eat Special K or Fruit Loops (1.5 cup) with milk. If she eats out at dinner she will eat grilled chicken salad or [place] food or [place] food.  Kelli Allison is currently in the action stage of change and ready to dedicate time achieving and maintaining a healthier weight. Kelli Allison is interested in becoming our patient and working on intensive lifestyle modifications including (but not limited to) diet and exercise for weight loss.  Kelli Allison's habits were reviewed today and are as follows: her desired weight loss is 35 lbs, she has been heavy most of her life, she started gaining weight in her 40's, her heaviest weight ever was 210 pounds, she has significant food cravings issues, she snacks frequently in the evenings, she skips meals frequently, she is frequently drinking liquids with calories, she frequently makes poor food choices, she frequently eats larger portions than normal, she has binge eating behaviors and she struggles with emotional eating.  Depression Screen Kelli Allison's Food and Mood (modified PHQ-9) score was 12.  Depression screen PHQ 2/9 04/18/2020    Decreased Interest 1  Down, Depressed, Hopeless 1  PHQ - 2 Score 2  Altered sleeping 2  Tired, decreased energy 2  Change in appetite 2  Feeling bad or failure about yourself  3  Trouble concentrating 1  Moving slowly or fidgety/restless 0  Suicidal thoughts 0  PHQ-9 Score 12  Difficult doing work/chores Somewhat difficult   Subjective:   1. Other fatigue Kelli Allison admits to daytime somnolence and admits to waking up still tired. Patent has a history of symptoms of daytime fatigue. Kelli Allison generally gets 4 hours of sleep per night, and states that she has difficulty falling asleep. Snoring is present. Apneic episodes are not present. Epworth Sleepiness Score is 1. EKG-normal sinus rhythm at 61 BPM.  2. SOB (shortness of breath) on exertion Kelli Allison notes increasing shortness of breath with exercising and seems to be worsening over time with weight gain. She notes getting out of breath sooner with activity than she used to. This has not gotten worse recently. Kelli Allison denies shortness of breath at rest or orthopnea.  3. Other specified hypothyroidism Kelli Allison was diagnosed with hypothyroidism in her late 20's. She is on levothyroxine 75 mcg daily.  4. B12 deficiency Kelli Allison is on B12 1,000 daily, and she notes fatigue.  5. At risk for deficient intake of food The patient is at a higher than average risk of deficient intake of food due to often skipping 1-2 meals per day.  Assessment/Plan:   1. Other fatigue Kelli Allison does feel that her weight is causing her energy to be lower  than it should be. Fatigue may be related to obesity, depression or many other causes. Labs will be ordered, and in the meanwhile, Kelli Allison will focus on self care including making healthy food choices, increasing physical activity and focusing on stress reduction.  - EKG 12-Lead - Comprehensive metabolic panel - CBC with Differential/Platelet - Hemoglobin A1c - Insulin, random - Lipid Panel With LDL/HDL Ratio -  VITAMIN D 25 Hydroxy (Vit-D Deficiency, Fractures) - Folate  2. SOB (shortness of breath) on exertion Kelli Allison does feel that she gets out of breath more easily that she used to when she exercises. Kelli Allison's shortness of breath appears to be obesity related and exercise induced. She has agreed to work on weight loss and gradually increase exercise to treat her exercise induced shortness of breath. Will continue to monitor closely.  - Comprehensive metabolic panel - CBC with Differential/Platelet - Hemoglobin A1c - Insulin, random - Lipid Panel With LDL/HDL Ratio - VITAMIN D 25 Hydroxy (Vit-D Deficiency, Fractures) - Folate  3. Other specified hypothyroidism Patient with long-standing hypothyroidism, on levothyroxine therapy. Recie appears euthyroid. We will check thyroid panel today. Orders and follow up as documented in patient record.  Counseling . Good thyroid control is important for overall health. Supratherapeutic thyroid levels are dangerous and will not improve weight loss results. . The correct way to take levothyroxine is fasting, with water, separated by at least 30 minutes from breakfast, and separated by more than 4 hours from calcium, iron, multivitamins, acid reflux medications (PPIs).   - T3 - T4 - TSH  4. B12 deficiency The diagnosis was reviewed with the patient. Counseling provided today, see below. We will check B12 today, and will continue to monitor. Kelli Allison will follow up as directed. Orders and follow up as documented in patient record.  Counseling . The body needs vitamin B12: to make red blood cells; to make DNA; and to help the nerves work properly so they can carry messages from the brain to the body.  . The main causes of vitamin B12 deficiency include dietary deficiency, digestive diseases, pernicious anemia, and having a surgery in which part of the stomach or small intestine is removed.  . Certain medicines can make it harder for the body to absorb vitamin  B12. These medicines include: heartburn medications; some antibiotics; some medications used to treat diabetes, gout, and high cholesterol.  . In some cases, there are no symptoms of this condition. If the condition leads to anemia or nerve damage, various symptoms can occur, such as weakness or fatigue, shortness of breath, and numbness or tingling in your hands and feet.   . Treatment:  o May include taking vitamin B12 supplements.  o Avoid alcohol.  o Eat lots of healthy foods that contain vitamin B12: - Beef, pork, chicken, [place], and organ meats, such as liver.  - Seafood: This includes clams, rainbow trout, salmon, tuna, and haddock. Eggs.  - Cereal and dairy products that are fortified: This means that vitamin B12 has been added to the food.   - Vitamin B12  5. Depression screening [place] had a positive depression screening. Depression is commonly associated with obesity and often results in emotional eating behaviors. We will monitor this closely and work on CBT to help improve the non-hunger eating patterns. Referral to Psychology may be required if no improvement is seen as she continues in our clinic.  6. At risk for deficient intake of food Ticia was given approximately 15 minutes of deficit intake of food prevention  counseling today. Skyelar is at risk for eating too few calories based on current food recall. She was encouraged to focus on meeting caloric and protein goals according to her recommended meal plan.   7. Class 1 obesity with serious comorbidity and body mass index (BMI) of 33.0 to 33.9 in adult, unspecified obesity type Ciclaly is currently in the action stage of change and her goal is to continue with weight loss efforts. I recommend Sunnie begin the structured treatment plan as follows:  She has agreed to the Category 1 Plan + 100 calories.  Exercise goals: No exercise has been prescribed at this time.   Behavioral modification strategies: increasing lean protein  intake, increasing vegetables, meal planning and cooking strategies, keeping healthy foods in the home and planning for success.  She was informed of the importance of frequent follow-up visits to maximize her success with intensive lifestyle modifications for her multiple health conditions. She was informed we would discuss her lab results at her next visit unless there is a critical issue that needs to be addressed sooner. Eduarda agreed to keep her next visit at the agreed upon time to discuss these results.  Objective:   Blood pressure 98/60, pulse 65, temperature 97.7 F (36.5 C), temperature source Oral, height 5\' 1"  (1.549 m), weight 175 lb (79.4 kg), SpO2 95 %. Body mass index is 33.07 kg/m.  EKG: Normal sinus rhythm, rate 61 BPM.  Indirect Calorimeter completed today shows a VO2 of 192 and a REE of 1336.  Her calculated basal metabolic rate is thus her basal metabolic rate is worse than expected.  General: Cooperative, alert, well developed, in no acute distress. HEENT: Conjunctivae and lids unremarkable. Cardiovascular: Regular rhythm.  Lungs: Normal work of breathing. Neurologic: No focal deficits.   Lab Results  Component Value Date   CREATININE 0.71 04/18/2020   BUN 12 04/18/2020   NA 142 04/18/2020   K 4.5 04/18/2020   CL 103 04/18/2020   CO2 25 04/18/2020   Lab Results  Component Value Date   ALT 11 04/18/2020   AST 10 04/18/2020   ALKPHOS 121 04/18/2020   BILITOT 0.2 04/18/2020   Lab Results  Component Value Date   HGBA1C 5.6 04/18/2020   Lab Results  Component Value Date   INSULIN 26.1 (H) 04/18/2020   Lab Results  Component Value Date   TSH 0.586 04/18/2020   Lab Results  Component Value Date   CHOL 227 (H) 04/18/2020   HDL 72 04/18/2020   LDLCALC 137 (H) 04/18/2020   TRIG 101 04/18/2020   Lab Results  Component Value Date   WBC 6.3 04/18/2020   HGB 14.5 04/18/2020   HCT 45.2 04/18/2020   MCV 93 04/18/2020   PLT 230 04/18/2020   No  results found for: IRON, TIBC, FERRITIN  Attestation Statements:   Reviewed by clinician on day of visit: allergies, medications, problem list, medical history, surgical history, family history, social history, and previous encounter notes.   I, 06/18/2020, am acting as transcriptionist for Burt Knack, MD.  I have reviewed the above documentation for accuracy and completeness, and I agree with the above. - Reuben Likes, MD

## 2020-05-02 ENCOUNTER — Other Ambulatory Visit: Payer: Self-pay

## 2020-05-02 ENCOUNTER — Ambulatory Visit (INDEPENDENT_AMBULATORY_CARE_PROVIDER_SITE_OTHER): Payer: 59 | Admitting: Family Medicine

## 2020-05-02 ENCOUNTER — Encounter (INDEPENDENT_AMBULATORY_CARE_PROVIDER_SITE_OTHER): Payer: Self-pay | Admitting: Family Medicine

## 2020-05-02 VITALS — BP 97/63 | HR 59 | Temp 97.8°F | Ht 61.0 in | Wt 178.0 lb

## 2020-05-02 DIAGNOSIS — E559 Vitamin D deficiency, unspecified: Secondary | ICD-10-CM | POA: Diagnosis not present

## 2020-05-02 DIAGNOSIS — E8881 Metabolic syndrome: Secondary | ICD-10-CM | POA: Diagnosis not present

## 2020-05-02 DIAGNOSIS — Z6833 Body mass index (BMI) 33.0-33.9, adult: Secondary | ICD-10-CM

## 2020-05-02 DIAGNOSIS — E669 Obesity, unspecified: Secondary | ICD-10-CM

## 2020-05-02 DIAGNOSIS — E7849 Other hyperlipidemia: Secondary | ICD-10-CM

## 2020-05-02 DIAGNOSIS — Z9189 Other specified personal risk factors, not elsewhere classified: Secondary | ICD-10-CM

## 2020-05-02 MED ORDER — VITAMIN D (ERGOCALCIFEROL) 1.25 MG (50000 UNIT) PO CAPS: 50000.0000 [IU] | ORAL_CAPSULE | ORAL | 0 refills | Status: AC

## 2020-05-03 NOTE — Progress Notes (Signed)
Chief Complaint:   OBESITY Kelli Allison is here to discuss her progress with her obesity treatment plan along with follow-up of her obesity related diagnoses. Kelli Allison is on the Category 1 Plan + 100 calories and states she is following her eating plan approximately 100% of the time. Kelli Allison states she is walking for 60 minutes 3-4 times per week.  Today's visit was #: 2 Starting weight: 175 lbs Starting date: 04/18/2020 Today's weight: 178 lbs Today's date: 05/02/2020 Total lbs lost to date: 0 Total lbs lost since last in-office visit: 0  Interim History: Kelli Allison voices her energy has improved. She mentions she feels full all the time. She is doing lite mayo on her sandwich, 100 calories snack pack or fruit for snacks. She did crave some ice cream but she has been eating all natural fruit bars. She is doing salad as an options at lunch.  Subjective:   1. Vitamin D deficiency Kelli Allison's Vit D is 22.4, and she not on OTC Vit D. She notes fatigue. I discussed labs with the patient today.  2. Other hyperlipidemia Kelli Allison's LDL is 137, HDL 72, and triglycerides 387. She is not on statin, and her 10 year ASCVD risk score is 1%.  3. Insulin resistance Kelli Allison's A1c is 5.6 and insulin 26.1. She notes carbohydrate cravings for ice cream. She is not on medications.  4. At risk for diabetes mellitus Kelli Allison is at higher than average risk for developing diabetes due to her obesity.   Assessment/Plan:   1. Vitamin D deficiency Low Vitamin D level contributes to fatigue and are associated with obesity, breast, and colon cancer. Kelli Allison agreed to start prescription Vitamin D 50,000 IU every week with no refills. She will follow-up for routine testing of Vitamin D, at least 2-3 times per year to avoid over-replacement.  - Vitamin D, Ergocalciferol, (DRISDOL) 1.25 MG (50000 UNIT) CAPS capsule; Take 1 capsule (50,000 Units total) by mouth every 7 (seven) days.  Dispense: 4 capsule; Refill: 0  2. Other  hyperlipidemia Cardiovascular risk and specific lipid/LDL goals reviewed. We discussed several lifestyle modifications today. Lifestyle changes were encouraged. Kelli Allison will continue to work on diet, exercise and weight loss efforts. Orders and follow up as documented in patient record.   Counseling Intensive lifestyle modifications are the first line treatment for this issue. . Dietary changes: Increase soluble fiber. Decrease simple carbohydrates. . Exercise changes: Moderate to vigorous-intensity aerobic activity 150 minutes per week if tolerated. . Lipid-lowering medications: see documented in medical record.  3. Insulin resistance Kelli Allison will continue to work on weight loss, exercise, and decreasing simple carbohydrates to help decrease the risk of diabetes. We will repeat labs in 3 months. If her hunger increases or cravings and weight increases, then we will discuss starting medications. Kelli Allison agreed to follow-up with [place] as directed to closely monitor her progress.  4. At risk for diabetes mellitus Kelli Allison was given approximately 20 minutes of diabetes education and counseling today. We discussed intensive lifestyle modifications today with an emphasis on weight loss as well as increasing exercise and decreasing simple carbohydrates in her diet. We also reviewed medication options with an emphasis on risk versus benefit of those discussed.   Repetitive spaced learning was employed today to elicit superior memory formation and behavioral change.  5. Class 1 obesity with serious comorbidity and body mass index (BMI) of 33.0 to 33.9 in adult, unspecified obesity type Kelli Allison is currently in the action stage of change. As such, her goal is to  continue with weight loss efforts. She has agreed to the Category 1 Plan + 100 calories.   Exercise goals: All adults should avoid inactivity. Some physical activity is better than none, and adults who participate in any amount of physical activity gain  some health benefits.  Behavioral modification strategies: increasing lean protein intake, increasing vegetables, meal planning and cooking strategies and planning for success.  Kelli Allison has agreed to follow-up with our clinic in 2 weeks. She was informed of the importance of frequent follow-up visits to maximize her success with intensive lifestyle modifications for her multiple health conditions.   Objective:   Blood pressure 97/63, pulse (!) 59, temperature 97.8 F (36.6 C), temperature source Oral, height 5\' 1"  (1.549 m), weight 178 lb (80.7 kg), SpO2 98 %. Body mass index is 33.63 kg/m.  General: Cooperative, alert, well developed, in no acute distress. HEENT: Conjunctivae and lids unremarkable. Cardiovascular: Regular rhythm.  Lungs: Normal work of breathing. Neurologic: No focal deficits.   Lab Results  Component Value Date   CREATININE 0.71 04/18/2020   BUN 12 04/18/2020   NA 142 04/18/2020   K 4.5 04/18/2020   CL 103 04/18/2020   CO2 25 04/18/2020   Lab Results  Component Value Date   ALT 11 04/18/2020   AST 10 04/18/2020   ALKPHOS 121 04/18/2020   BILITOT 0.2 04/18/2020   Lab Results  Component Value Date   HGBA1C 5.6 04/18/2020   Lab Results  Component Value Date   INSULIN 26.1 (H) 04/18/2020   Lab Results  Component Value Date   TSH 0.586 04/18/2020   Lab Results  Component Value Date   CHOL 227 (H) 04/18/2020   HDL 72 04/18/2020   LDLCALC 137 (H) 04/18/2020   TRIG 101 04/18/2020   Lab Results  Component Value Date   WBC 6.3 04/18/2020   HGB 14.5 04/18/2020   HCT 45.2 04/18/2020   MCV 93 04/18/2020   PLT 230 04/18/2020   No results found for: IRON, TIBC, FERRITIN  Attestation Statements:   Reviewed by clinician on day of visit: allergies, medications, problem list, medical history, surgical history, family history, social history, and previous encounter notes.   I, 06/18/2020, am acting as transcriptionist for Burt Knack,  MD.  I have reviewed the above documentation for accuracy and completeness, and I agree with the above. - Reuben Likes, MD

## 2020-05-25 ENCOUNTER — Ambulatory Visit (INDEPENDENT_AMBULATORY_CARE_PROVIDER_SITE_OTHER): Payer: 59 | Admitting: Family Medicine

## 2021-02-20 ENCOUNTER — Emergency Department (HOSPITAL_BASED_OUTPATIENT_CLINIC_OR_DEPARTMENT_OTHER)
Admission: EM | Admit: 2021-02-20 | Discharge: 2021-02-20 | Disposition: A | Discharge status: Home / Self Care | Payer: 59 | Attending: Emergency Medicine | Admitting: Emergency Medicine

## 2021-02-20 ENCOUNTER — Other Ambulatory Visit: Payer: Self-pay

## 2021-02-20 ENCOUNTER — Encounter (HOSPITAL_BASED_OUTPATIENT_CLINIC_OR_DEPARTMENT_OTHER): Payer: Self-pay | Admitting: *Deleted

## 2021-02-20 ENCOUNTER — Emergency Department (HOSPITAL_BASED_OUTPATIENT_CLINIC_OR_DEPARTMENT_OTHER): Payer: 59

## 2021-02-20 DIAGNOSIS — R0789 Other chest pain: Secondary | ICD-10-CM

## 2021-02-20 DIAGNOSIS — E039 Hypothyroidism, unspecified: Secondary | ICD-10-CM | POA: Diagnosis not present

## 2021-02-20 DIAGNOSIS — R1319 Other dysphagia: Secondary | ICD-10-CM

## 2021-02-20 DIAGNOSIS — R059 Cough, unspecified: Secondary | ICD-10-CM | POA: Insufficient documentation

## 2021-02-20 DIAGNOSIS — Z79899 Other long term (current) drug therapy: Secondary | ICD-10-CM | POA: Diagnosis not present

## 2021-02-20 LAB — CBC WITH DIFFERENTIAL/PLATELET
Abs Immature Granulocytes: 0.03 10*3/uL (ref 0.00–0.07)
Basophils Absolute: 0 10*3/uL (ref 0.0–0.1)
Basophils Relative: 0 %
Eosinophils Absolute: 0.2 10*3/uL (ref 0.0–0.5)
Eosinophils Relative: 2 %
HCT: 41 % (ref 36.0–46.0)
Hemoglobin: 13.8 g/dL (ref 12.0–15.0)
Immature Granulocytes: 0 %
Lymphocytes Relative: 28 %
Lymphs Abs: 2.6 10*3/uL (ref 0.7–4.0)
MCH: 30.2 pg (ref 26.0–34.0)
MCHC: 33.7 g/dL (ref 30.0–36.0)
MCV: 89.7 fL (ref 80.0–100.0)
Monocytes Absolute: 0.6 10*3/uL (ref 0.1–1.0)
Monocytes Relative: 7 %
Neutro Abs: 6 10*3/uL (ref 1.7–7.7)
Neutrophils Relative %: 63 %
Platelets: 223 10*3/uL (ref 150–400)
RBC: 4.57 MIL/uL (ref 3.87–5.11)
RDW: 13.2 % (ref 11.5–15.5)
WBC: 9.5 10*3/uL (ref 4.0–10.5)
nRBC: 0 % (ref 0.0–0.2)

## 2021-02-20 LAB — COMPREHENSIVE METABOLIC PANEL
ALT: 10 U/L (ref 0–44)
AST: 17 U/L (ref 15–41)
Albumin: 3.8 g/dL (ref 3.5–5.0)
Alkaline Phosphatase: 96 U/L (ref 38–126)
Anion gap: 6 (ref 5–15)
BUN: 13 mg/dL (ref 6–20)
CO2: 27 mmol/L (ref 22–32)
Calcium: 8.8 mg/dL — ABNORMAL LOW (ref 8.9–10.3)
Chloride: 105 mmol/L (ref 98–111)
Creatinine, Ser: 0.64 mg/dL (ref 0.44–1.00)
GFR, Estimated: >60 mL/min (ref >60)
Glucose, Bld: 117 mg/dL — ABNORMAL HIGH (ref 70–99)
Potassium: 3.5 mmol/L (ref 3.5–5.1)
Sodium: 138 mmol/L (ref 135–145)
Total Bilirubin: 0.1 mg/dL — ABNORMAL LOW (ref 0.3–1.2)
Total Protein: 6.7 g/dL (ref 6.5–8.1)

## 2021-02-20 LAB — URINALYSIS, ROUTINE W REFLEX MICROSCOPIC
Bilirubin Urine: NEGATIVE
Glucose, UA: NEGATIVE mg/dL
Hgb urine dipstick: NEGATIVE
Ketones, ur: NEGATIVE mg/dL
Leukocytes,Ua: NEGATIVE
Nitrite: NEGATIVE
Protein, ur: NEGATIVE mg/dL
Specific Gravity, Urine: 1.015 (ref 1.005–1.030)
pH: 6.5 (ref 5.0–8.0)

## 2021-02-20 LAB — LIPASE, BLOOD: Lipase: 33 U/L (ref 11–51)

## 2021-02-20 LAB — TROPONIN I (HIGH SENSITIVITY): Troponin I (High Sensitivity): <2 ng/L (ref <18)

## 2021-02-20 MED ORDER — SUCRALFATE 1 GM/10ML PO SUSP
1.0000 g | Freq: Three times a day (TID) | ORAL | 0 refills | Status: AC
Start: 2021-02-20 — End: ?

## 2021-02-20 MED ORDER — ALUM & MAG HYDROXIDE-SIMETH 200-200-20 MG/5ML PO SUSP
30.0000 mL | Freq: Once | ORAL | Status: AC
  Administered 2021-02-20: 30 mL via ORAL
  Filled 2021-02-20: qty 30

## 2021-02-20 MED ORDER — LIDOCAINE VISCOUS HCL 2 % MT SOLN
15.0000 mL | Freq: Once | OROMUCOSAL | Status: AC
  Administered 2021-02-20: 15 mL via ORAL
  Filled 2021-02-20: qty 15

## 2021-02-20 MED ORDER — OMEPRAZOLE 40 MG PO CPDR: 40.0000 mg | DELAYED_RELEASE_CAPSULE | Freq: Every day | ORAL | 0 refills | Status: DC

## 2021-02-20 MED ORDER — SUCRALFATE 1 GM/10ML PO SUSP: 1.0000 g | Freq: Three times a day (TID) | ORAL | 0 refills | Status: DC
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
## 2021-02-20 NOTE — ED Triage Notes (Signed)
After eating lunch and attempted to drink water and eat it would not go down. Hx of having her esophagus stretched years ago. EKG done at triage.

## 2021-02-20 NOTE — Discharge Instructions (Addendum)
You were seen in the emergency department for chest pain and sensation of food getting stuck in your chest  Lab work, imaging today was reassuring  I suspect your pain today was due to some esophageal or stomach issue, possibly a stricture  We discussed starting a soft and pured diet until you see gastroenterology.  They may recommend a repeat upper endoscopy  Start taking omeprazole every morning on empty stomach 30 to 60 minutes before eating or drinking.  Take famotidine 20 mg with breakfast and dinner.  Use Carafate suspension 4 times a day with meals and once before bedtime.  Avoid ibuprofen, aspirin, alcohol, greasy or spicy meals or liquids  Return to the ED immediately if you have return of severe chest pain, shortness of breath, tearing back pain or abdominal pain, coughing up blood, vomiting blood, blood in your stool or black stools

## 2022-05-31 ENCOUNTER — Other Ambulatory Visit: Payer: Self-pay | Admitting: Nurse Practitioner

## 2022-05-31 ENCOUNTER — Ambulatory Visit
Admission: RE | Admit: 2022-05-31 | Discharge: 2022-05-31 | Disposition: A | Discharge status: Home / Self Care | Payer: 59 | Source: Ambulatory Visit | Attending: Nurse Practitioner | Admitting: Nurse Practitioner

## 2022-05-31 DIAGNOSIS — R059 Cough, unspecified: Secondary | ICD-10-CM

## 2024-01-19 ENCOUNTER — Emergency Department (HOSPITAL_COMMUNITY)

## 2024-01-19 ENCOUNTER — Encounter (HOSPITAL_COMMUNITY): Payer: Self-pay | Admitting: *Deleted

## 2024-01-19 ENCOUNTER — Other Ambulatory Visit: Payer: Self-pay

## 2024-01-19 ENCOUNTER — Emergency Department (HOSPITAL_COMMUNITY)
Admission: EM | Admit: 2024-01-19 | Discharge: 2024-01-19 | Disposition: A | Discharge status: Home / Self Care | Attending: Emergency Medicine | Admitting: Emergency Medicine

## 2024-01-19 DIAGNOSIS — S0990XA Unspecified injury of head, initial encounter: Secondary | ICD-10-CM | POA: Diagnosis present

## 2024-01-19 DIAGNOSIS — S3991XA Unspecified injury of abdomen, initial encounter: Secondary | ICD-10-CM | POA: Insufficient documentation

## 2024-01-19 DIAGNOSIS — S299XXA Unspecified injury of thorax, initial encounter: Secondary | ICD-10-CM | POA: Insufficient documentation

## 2024-01-19 DIAGNOSIS — R791 Abnormal coagulation profile: Secondary | ICD-10-CM | POA: Diagnosis not present

## 2024-01-19 LAB — CBC
HCT: 42.3 % (ref 36.0–46.0)
Hemoglobin: 14 g/dL (ref 12.0–15.0)
MCH: 29.8 pg (ref 26.0–34.0)
MCHC: 33.1 g/dL (ref 30.0–36.0)
MCV: 90 fL (ref 80.0–100.0)
Platelets: 280 10*3/uL (ref 150–400)
RBC: 4.7 MIL/uL (ref 3.87–5.11)
RDW: 14 % (ref 11.5–15.5)
WBC: 9.2 10*3/uL (ref 4.0–10.5)
nRBC: 0 % (ref 0.0–0.2)

## 2024-01-19 LAB — COMPREHENSIVE METABOLIC PANEL WITH GFR
ALT: 11 U/L (ref 0–44)
AST: 20 U/L (ref 15–41)
Albumin: 3.8 g/dL (ref 3.5–5.0)
Alkaline Phosphatase: 68 U/L (ref 38–126)
Anion gap: 11 (ref 5–15)
BUN: 10 mg/dL (ref 6–20)
CO2: 25 mmol/L (ref 22–32)
Calcium: 9.1 mg/dL (ref 8.9–10.3)
Chloride: 103 mmol/L (ref 98–111)
Creatinine, Ser: 0.82 mg/dL (ref 0.44–1.00)
GFR, Estimated: >60 mL/min (ref >60)
Glucose, Bld: 101 mg/dL — ABNORMAL HIGH (ref 70–99)
Potassium: 3.4 mmol/L — ABNORMAL LOW (ref 3.5–5.1)
Sodium: 139 mmol/L (ref 135–145)
Total Bilirubin: 0.6 mg/dL (ref 0.0–1.2)
Total Protein: 6.5 g/dL (ref 6.5–8.1)

## 2024-01-19 LAB — SAMPLE TO BLOOD BANK

## 2024-01-19 LAB — I-STAT CHEM 8, ED
BUN: 10 mg/dL (ref 6–20)
Calcium, Ion: 1.09 mmol/L — ABNORMAL LOW (ref 1.15–1.40)
Chloride: 103 mmol/L (ref 98–111)
Creatinine, Ser: 0.7 mg/dL (ref 0.44–1.00)
Glucose, Bld: 98 mg/dL (ref 70–99)
HCT: 42 % (ref 36.0–46.0)
Hemoglobin: 14.3 g/dL (ref 12.0–15.0)
Potassium: 3.3 mmol/L — ABNORMAL LOW (ref 3.5–5.1)
Sodium: 139 mmol/L (ref 135–145)
TCO2: 25 mmol/L (ref 22–32)

## 2024-01-19 LAB — ETHANOL: Alcohol, Ethyl (B): <15 mg/dL (ref <15)

## 2024-01-19 LAB — I-STAT CG4 LACTIC ACID, ED: Lactic Acid, Venous: 0.9 mmol/L (ref 0.5–1.9)

## 2024-01-19 LAB — PROTIME-INR
INR: 1 (ref 0.8–1.2)
Prothrombin Time: 13.3 s (ref 11.4–15.2)

## 2024-01-19 MED ORDER — METOCLOPRAMIDE HCL 5 MG/ML IJ SOLN
10.0000 mg | Freq: Once | INTRAMUSCULAR | Status: AC
  Administered 2024-01-19: 10 mg via INTRAVENOUS
  Filled 2024-01-19: qty 2

## 2024-01-19 MED ORDER — CYCLOBENZAPRINE HCL 10 MG PO TABS: 10.0000 mg | ORAL_TABLET | Freq: Two times a day (BID) | ORAL | 0 refills | Status: AC | PRN

## 2024-01-19 MED ORDER — IOHEXOL 350 MG/ML SOLN
75.0000 mL | Freq: Once | INTRAVENOUS | Status: AC | PRN
  Administered 2024-01-19: 75 mL via INTRAVENOUS
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
## 2024-01-19 NOTE — Progress Notes (Signed)
 Orthopedic Tech Progress Note Patient Details:  Kelli Allison 12-Aug-1966 [id number]  Patient ID: Kelli Allison, female   DOB: June 07, 1966, 58 y.o.   MRN: [id number] I attended trauma page. [name] 01/19/2024, 8:49 PM
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
## 2024-03-12 ENCOUNTER — Emergency Department (HOSPITAL_BASED_OUTPATIENT_CLINIC_OR_DEPARTMENT_OTHER): Admitting: Radiology

## 2024-03-12 ENCOUNTER — Other Ambulatory Visit: Payer: Self-pay

## 2024-03-12 ENCOUNTER — Emergency Department (HOSPITAL_BASED_OUTPATIENT_CLINIC_OR_DEPARTMENT_OTHER)
Admission: EM | Admit: 2024-03-12 | Discharge: 2024-03-13 | Disposition: A | Discharge status: Home / Self Care | Attending: Emergency Medicine | Admitting: Emergency Medicine

## 2024-03-12 DIAGNOSIS — S63502A Unspecified sprain of left wrist, initial encounter: Secondary | ICD-10-CM | POA: Diagnosis not present

## 2024-03-12 DIAGNOSIS — S0990XA Unspecified injury of head, initial encounter: Secondary | ICD-10-CM | POA: Diagnosis present

## 2024-03-12 DIAGNOSIS — X58XXXA Exposure to other specified factors, initial encounter: Secondary | ICD-10-CM | POA: Insufficient documentation

## 2024-03-12 DIAGNOSIS — E039 Hypothyroidism, unspecified: Secondary | ICD-10-CM | POA: Insufficient documentation

## 2024-03-12 DIAGNOSIS — Z79899 Other long term (current) drug therapy: Secondary | ICD-10-CM | POA: Diagnosis not present

## 2024-03-12 DIAGNOSIS — S060XAA Concussion with loss of consciousness status unknown, initial encounter: Secondary | ICD-10-CM | POA: Insufficient documentation

## 2024-03-12 DIAGNOSIS — S43402A Unspecified sprain of left shoulder joint, initial encounter: Secondary | ICD-10-CM | POA: Diagnosis not present

## 2024-03-12 MED ORDER — METHOCARBAMOL 500 MG PO TABS: 500.0000 mg | ORAL_TABLET | Freq: Two times a day (BID) | ORAL | 0 refills | Status: DC

## 2024-03-12 MED ORDER — METHOCARBAMOL 500 MG PO TABS
500.0000 mg | ORAL_TABLET | Freq: Once | ORAL | Status: AC
  Administered 2024-03-13: 500 mg via ORAL
  Filled 2024-03-12: qty 1
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
## 2024-07-23 ENCOUNTER — Telehealth: Payer: Self-pay | Admitting: Internal Medicine

## 2024-07-23 ENCOUNTER — Telehealth: Payer: Self-pay | Admitting: Gastroenterology

## 2024-07-23 NOTE — Telephone Encounter (Signed)
 Hello Dr. Avram,   I coordinated with Kelli Allison to be able to schedule patient earlier than January. Patient has been scheduled for 11/26 with Kelli Allison. Patient also confirmed this appointment.   Thank you

## 2024-07-23 NOTE — Telephone Encounter (Signed)
 Good afternoon Dr. Avram,   I offered patient appointment for tomorrow with Camie. Patient declined to schedule for tomorrow due to being a short notice. Patient has been scheduled for next available 1/6.   Thank you

## 2024-07-23 NOTE — Telephone Encounter (Signed)
 I have availability to see her sooner than that - there are nurse visits and ways to see her sooner if she will schedule sooner - did she ask to wait until 1/6?

## 2024-07-23 NOTE — Telephone Encounter (Signed)
 Please schedule with  Camie tomorrow she has 2 openings

## 2024-07-23 NOTE — Telephone Encounter (Signed)
 Good morning Dr. Avram,   We received a referral for patient to be seen for mass in rectum. Will you please review and advise on scheduling further.   Thank you

## 2024-08-04 NOTE — Progress Notes (Deleted)
 Kelli Allison [id number] 1965/11/26   Chief Complaint:  Referring Provider: Loring Tanda Mae, * Primary GI MD: Dr. Avram  HPI: Kelli Allison is a 58 y.o. female with past medical history of hypothyroidism, IBS, kidney stones, migraines, obesity, stomach ulcer due to NSAIDs, cholecystectomy who presents today for a complaint of *** .    Has not been seen in our office since 2018.  Had EGD and colonoscopy in 2018.  Colon appeared normal at that time.  On upper endoscopy found to have a moderate Schatzki ring which was dilated, LA grade a reflux esophagitis, nonbleeding erosive gastropathy.  Patient is referred by PCP for evaluation of a rectal mass.     Discussed the use of AI scribe software for clinical note transcription with the patient, who gave verbal consent to proceed.  History of Present Illness       Previous GI Procedures/Imaging   CT chest/abdomen/pelvis 01/19/2024 IMPRESSION: 1. No CT evidence of acute traumatic injury to the chest, abdomen, or pelvis. 2. Status post cholecystectomy.  EGD 02/14/2017 - Moderate Schatzki ring. Dilated.  - LA Grade A reflux esophagitis. Biopsied.  - Non- bleeding erosive gastropathy. Biopsied.  - The examination was otherwise normal.  Colonoscopy 02/12/2017 - The perianal and digital rectal examinations were normal.  - The colon (entire examined portion) appeared normal.  - No additional abnormalities were found on retroflexion.  - The terminal ileum appeared normal. - Recall 10 years  Past Medical History:  Diagnosis Date   Hearing aid worn    Hearing loss    Hypothyroidism    IBS (irritable bowel syndrome)    Kidney stones    Migraine    Obesity    Stomach ulcer due to nonsteroidal anti-inflammatory drug (NSAID)     Past Surgical History:  Procedure Laterality Date   CESAREAN SECTION     CHOLECYSTECTOMY     INNER EAR SURGERY     TUBAL LIGATION      Current Outpatient Medications  Medication Sig  Dispense Refill   cyclobenzaprine  (FLEXERIL ) 10 MG tablet Take 1 tablet (10 mg total) by mouth 2 (two) times daily as needed for up to 20 doses for muscle spasms. 20 tablet 0   levothyroxine  (SYNTHROID , LEVOTHROID) 75 MCG tablet Take 1 tablet (75 mcg total) by mouth daily before breakfast. 90 tablet 1   methocarbamol  (ROBAXIN ) 500 MG tablet Take 1 tablet (500 mg total) by mouth 2 (two) times daily. 20 tablet 0   Multiple Vitamin (MULTIVITAMIN) tablet Take 1 tablet by mouth daily.     omeprazole  (PRILOSEC) 40 MG capsule Take 1 capsule (40 mg total) by mouth daily. Take every morning on empty stomach 30 to 60 minutes before eating or drinking 30 capsule 0   ondansetron  (ZOFRAN  ODT) 4 MG disintegrating tablet Take 1 tablet (4 mg total) by mouth every 8 (eight) hours as needed for nausea or vomiting. 4 tablet 0   sucralfate  (CARAFATE ) 1 GM/10ML suspension Take 10 mLs (1 g total) by mouth 4 (four) times daily -  with meals and at bedtime. (Patient taking differently: Take 1 g by mouth 2 (two) times daily.) 420 mL 0   traZODone (DESYREL) 150 MG tablet Take 150 mg by mouth at bedtime.     vitamin B-12 (CYANOCOBALAMIN) 1000 MCG tablet Take 1,000 mcg by mouth daily.     Vitamin D , Ergocalciferol , (DRISDOL ) 1.25 MG (50000 UNIT) CAPS capsule Take 1 capsule (50,000 Units total) by mouth every 7 (seven) days.  4 capsule 0   No current facility-administered medications for this visit.    Allergies as of 08/05/2024 - Reviewed 03/12/2024  Allergen Reaction Noted   Aspirin Nausea Only 05/08/2011   Caffeine  Other (See Comments) 05/08/2011   Ibuprofen  05/01/2017   Penicillins Hives 05/08/2011    Family History  Problem Relation Age of Onset   Alcohol abuse Mother    Asthma Mother    COPD Mother    Depression Mother    Drug abuse Mother    Miscarriages / Stillbirths Mother    Anxiety disorder Mother    Depression Brother    Learning disabilities Brother    Depression Daughter    Miscarriages /  Stillbirths Daughter    Birth defects Son        spinabifida, scoliosis   Kidney disease Son    Diabetes Maternal Grandmother    Heart disease Maternal Grandmother    Hyperlipidemia Maternal Grandmother    Hypertension Maternal Grandmother    Miscarriages / Stillbirths Maternal Grandmother    Asthma Maternal Grandfather    COPD Maternal Grandfather    Cancer Paternal Grandmother    Birth defects Brother        born with one kidney   Depression Brother    Learning disabilities Brother        dislexia    Social History   Tobacco Use   Smoking status: Never   Smokeless tobacco: Never  Vaping Use   Vaping status: Never Used  Substance Use Topics   Alcohol use: Yes    Comment: couple times a week.  None in a month   Drug use: No     Review of Systems:    Constitutional: No weight loss, fever, chills, weakness or fatigue Eyes: No change in vision Ears, Nose, Throat:  No change in hearing or congestion Skin: No rash or itching Cardiovascular: No chest pain, chest pressure or palpitations   Respiratory: No SOB or cough Gastrointestinal: See HPI and otherwise negative Genitourinary: No dysuria or change in urinary frequency Neurological: No headache, dizziness or syncope Musculoskeletal: No new muscle or joint pain Hematologic: No bleeding or bruising    Physical Exam:  Vital signs: There were no vitals taken for this visit.  Constitutional: NAD, Well developed, Well nourished, alert and cooperative Head:  Normocephalic and atraumatic.  Eyes: No scleral icterus. Conjunctiva pink. Mouth: No oral lesions. Respiratory: Respirations even and unlabored. Lungs clear to auscultation bilaterally.  No wheezes, crackles, or rhonchi.  Cardiovascular:  Regular rate and rhythm. No murmurs. No peripheral edema. Gastrointestinal:  Soft, nondistended, nontender. No rebound or guarding. Normal bowel sounds. No appreciable masses or hepatomegaly. Rectal:  Not performed.  Neurologic:   Alert and oriented x4;  grossly normal neurologically.  Skin:   Dry and intact without significant lesions or rashes. Psychiatric: Oriented to person, place and time. Demonstrates good judgement and reason without abnormal affect or behaviors.   RELEVANT LABS AND IMAGING: CBC    Component Value Date/Time   WBC 9.2 01/19/2024 2030   RBC 4.70 01/19/2024 2030   HGB 14.3 01/19/2024 2034   HGB 14.5 04/18/2020 1049   HCT 42.0 01/19/2024 2034   HCT 45.2 04/18/2020 1049   PLT 280 01/19/2024 2030   PLT 230 04/18/2020 1049   MCV 90.0 01/19/2024 2030   MCV 93 04/18/2020 1049   MCH 29.8 01/19/2024 2030   MCHC 33.1 01/19/2024 2030   RDW 14.0 01/19/2024 2030   RDW 13.1 04/18/2020 1049  LYMPHSABS 2.6 02/20/2021 1613   LYMPHSABS 2.0 04/18/2020 1049   MONOABS 0.6 02/20/2021 1613   EOSABS 0.2 02/20/2021 1613   EOSABS 0.1 04/18/2020 1049   BASOSABS 0.0 02/20/2021 1613   BASOSABS 0.1 04/18/2020 1049    CMP     Component Value Date/Time   NA 139 01/19/2024 2034   NA 142 04/18/2020 1049   K 3.3 (L) 01/19/2024 2034   CL 103 01/19/2024 2034   CO2 25 01/19/2024 2030   GLUCOSE 98 01/19/2024 2034   BUN 10 01/19/2024 2034   BUN 12 04/18/2020 1049   CREATININE 0.70 01/19/2024 2034   CALCIUM 9.1 01/19/2024 2030   PROT 6.5 01/19/2024 2030   PROT 6.7 04/18/2020 1049   ALBUMIN 3.8 01/19/2024 2030   ALBUMIN 4.2 04/18/2020 1049   AST 20 01/19/2024 2030   ALT 11 01/19/2024 2030   ALKPHOS 68 01/19/2024 2030   BILITOT 0.6 01/19/2024 2030   BILITOT 0.2 04/18/2020 1049   GFRNONAA >60 01/19/2024 2030   GFRAA 113 04/18/2020 1049     Assessment/Plan:    Assessment and Plan Assessment & Plan    Colonoscopy Consider pelvic imaging as well   Camie Furbish, PA-C [facility] 08/04/2024, 5:21 PM  Patient Care Team: Loring Tanda Mae, MD as PCP - General (Family Medicine)

## 2024-08-05 ENCOUNTER — Ambulatory Visit: Admitting: Gastroenterology

## 2024-08-20 ENCOUNTER — Ambulatory Visit: Payer: Self-pay | Admitting: Gastroenterology

## 2024-08-20 ENCOUNTER — Encounter: Payer: Self-pay | Admitting: Gastroenterology

## 2024-08-20 ENCOUNTER — Ambulatory Visit: Admitting: Gastroenterology

## 2024-08-20 ENCOUNTER — Other Ambulatory Visit (INDEPENDENT_AMBULATORY_CARE_PROVIDER_SITE_OTHER)

## 2024-08-20 VITALS — BP 122/70 | HR 69 | Ht 61.0 in | Wt 184.2 lb

## 2024-08-20 DIAGNOSIS — Z8711 Personal history of peptic ulcer disease: Secondary | ICD-10-CM

## 2024-08-20 DIAGNOSIS — K625 Hemorrhage of anus and rectum: Secondary | ICD-10-CM

## 2024-08-20 DIAGNOSIS — R195 Other fecal abnormalities: Secondary | ICD-10-CM

## 2024-08-20 DIAGNOSIS — Z860101 Personal history of adenomatous and serrated colon polyps: Secondary | ICD-10-CM

## 2024-08-20 DIAGNOSIS — K5900 Constipation, unspecified: Secondary | ICD-10-CM

## 2024-08-20 DIAGNOSIS — R194 Change in bowel habit: Secondary | ICD-10-CM

## 2024-08-20 DIAGNOSIS — K219 Gastro-esophageal reflux disease without esophagitis: Secondary | ICD-10-CM | POA: Diagnosis not present

## 2024-08-20 DIAGNOSIS — R1013 Epigastric pain: Secondary | ICD-10-CM

## 2024-08-20 DIAGNOSIS — K649 Unspecified hemorrhoids: Secondary | ICD-10-CM

## 2024-08-20 DIAGNOSIS — Z860100 Personal history of colon polyps, unspecified: Secondary | ICD-10-CM | POA: Diagnosis not present

## 2024-08-20 LAB — BASIC METABOLIC PANEL WITH GFR
BUN: 12 mg/dL (ref 6–23)
CO2: 29 meq/L (ref 19–32)
Calcium: 9.4 mg/dL (ref 8.4–10.5)
Chloride: 102 meq/L (ref 96–112)
Creatinine, Ser: 0.9 mg/dL (ref 0.40–1.20)
GFR: 71.39 mL/min (ref >60.00)
Glucose, Bld: 82 mg/dL (ref 70–99)
Potassium: 4.2 meq/L (ref 3.5–5.1)
Sodium: 139 meq/L (ref 135–145)

## 2024-08-20 LAB — CBC WITH DIFFERENTIAL/PLATELET
Basophils Absolute: 0.1 K/uL (ref 0.0–0.1)
Basophils Relative: 0.7 % (ref 0.0–3.0)
Eosinophils Absolute: 0.1 K/uL (ref 0.0–0.7)
Eosinophils Relative: 1.9 % (ref 0.0–5.0)
HCT: 41.8 % (ref 36.0–46.0)
Hemoglobin: 14.1 g/dL (ref 12.0–15.0)
Lymphocytes Relative: 27.9 % (ref 12.0–46.0)
Lymphs Abs: 2 K/uL (ref 0.7–4.0)
MCHC: 33.7 g/dL (ref 30.0–36.0)
MCV: 88.9 fl (ref 78.0–100.0)
Monocytes Absolute: 0.4 K/uL (ref 0.1–1.0)
Monocytes Relative: 6 % (ref 3.0–12.0)
Neutro Abs: 4.6 K/uL (ref 1.4–7.7)
Neutrophils Relative %: 63.5 % (ref 43.0–77.0)
Platelets: 234 K/uL (ref 150.0–400.0)
RBC: 4.7 Mil/uL (ref 3.87–5.11)
RDW: 14 % (ref 11.5–15.5)
WBC: 7.3 K/uL (ref 4.0–10.5)

## 2024-08-20 MED ORDER — OMEPRAZOLE 40 MG PO CPDR: 40.0000 mg | DELAYED_RELEASE_CAPSULE | Freq: Every day | ORAL | 3 refills | Status: DC

## 2024-08-20 MED ORDER — NA SULFATE-K SULFATE-MG SULF 17.5-3.13-1.6 GM/177ML PO SOLN: 1.0000 | Freq: Once | ORAL | 0 refills | Status: AC

## 2024-08-20 NOTE — Progress Notes (Signed)
 Kelli Allison [id number] 1965-11-16   Chief Complaint: Constipation, abdominal pain, nausea  Referring Provider: Loring Tanda Mae, MD Primary GI MD: Dr. Avram   HPI: Kelli Allison is a 58 y.o. female with past medical history of hypothyroidism, IBS, kidney stones, migraines, obesity, stomach ulcer due to NSAIDs, cholecystectomy who presents today for a complaint of constipation, abdominal pain, nausea.    Has not been seen in our office since 2018.  Had EGD and colonoscopy in 2018.  Colon appeared normal at that time.  On upper endoscopy found to have a moderate Schatzki ring which was dilated, LA grade a reflux esophagitis, nonbleeding erosive gastropathy.   Patient is referred by PCP for evaluation of a rectal mass.   Discussed the use of AI scribe software for clinical note transcription with the patient, who gave verbal consent to proceed.  History of Present Illness Kelli Allison is a 58 year old female who presents with concern about a possible rectal mass and changes in bowel habits. She was referred by her primary care physician for evaluation of a possible rectal mass.  Rectal mass and altered bowel habits - Concern for possible rectal mass with referral from primary care physician. - Thin stools described as 'spaghetti or confetti'. - Bulging sensation in the rectal area. - Intermittent need for manual assistance with bowel movements. - Bowel movements typically two to three times per week, consistent with lifelong pattern. - Occasional blood in stool, attributed to hemorrhoids with a long-standing history. - Uses Dulcolax as needed; has tried fiber and MiraLAX with variable success. - Avoids fried foods and attempts to eat healthily. - History of colonoscopy and upper/lower GI studies revealing polyps.  Upper gastrointestinal symptoms - Constant upper abdominal burning pain, not related to food intake. - Intermittent dark black stools. - No recent use of  Pepto Bismol or iron supplements. - History of stomach ulcers in her twenties and forties. - Recent medication change from a previously effective capsule to Famotidine , taking two pills in the morning. - Burning stomach sensation since medication change. - Last upper endoscopy in 2018 showed esophagitis. - History of NSAID use contributing to prior ulcer formation.  Insurance and access to care - Concern regarding insurance coverage as Medicaid may have expired or is soon to expire. - Job insurance will not start until February, affecting ability to proceed with recommended diagnostic procedures.    Previous GI Procedures/Imaging   CT chest/abdomen/pelvis 01/19/2024 IMPRESSION: 1. No CT evidence of acute traumatic injury to the chest, abdomen, or pelvis. 2. Status post cholecystectomy.   EGD 02/14/2017 - Moderate Schatzki ring. Dilated.  - LA Grade A reflux esophagitis. Biopsied.  - Non- bleeding erosive gastropathy. Biopsied.  - The examination was otherwise normal.   Colonoscopy 02/12/2017 - The perianal and digital rectal examinations were normal.  - The colon (entire examined portion) appeared normal.  - No additional abnormalities were found on retroflexion.  - The terminal ileum appeared normal. - Recall 10 years   Past Medical History:  Diagnosis Date   Hearing aid worn    Hearing loss    Hypothyroidism    IBS (irritable bowel syndrome)    Kidney stones    Migraine    Obesity    Stomach ulcer due to nonsteroidal anti-inflammatory drug (NSAID)     Past Surgical History:  Procedure Laterality Date   CESAREAN SECTION     CHOLECYSTECTOMY     INNER EAR SURGERY     TUBAL  LIGATION      Current Outpatient Medications  Medication Sig Dispense Refill   cyclobenzaprine  (FLEXERIL ) 10 MG tablet Take 1 tablet (10 mg total) by mouth 2 (two) times daily as needed for up to 20 doses for muscle spasms. 20 tablet 0   famotidine  (PEPCID ) 20 MG tablet Take 1-2 tablets by mouth  daily.     levothyroxine  (SYNTHROID , LEVOTHROID) 75 MCG tablet Take 1 tablet (75 mcg total) by mouth daily before breakfast. 90 tablet 1   Multiple Vitamin (MULTIVITAMIN) tablet Take 1 tablet by mouth daily.     omeprazole  (PRILOSEC) 40 MG capsule Take 1 capsule (40 mg total) by mouth daily. Take every morning on empty stomach 30 to 60 minutes before eating or drinking 30 capsule 0   ondansetron  (ZOFRAN  ODT) 4 MG disintegrating tablet Take 1 tablet (4 mg total) by mouth every 8 (eight) hours as needed for nausea or vomiting. 4 tablet 0   traZODone (DESYREL) 150 MG tablet Take 150 mg by mouth at bedtime.     vitamin B-12 (CYANOCOBALAMIN) 1000 MCG tablet Take 1,000 mcg by mouth daily.     Vitamin D , Ergocalciferol , (DRISDOL ) 1.25 MG (50000 UNIT) CAPS capsule Take 1 capsule (50,000 Units total) by mouth every 7 (seven) days. 4 capsule 0   No current facility-administered medications for this visit.    Allergies as of 08/20/2024 - Review Complete 08/20/2024  Allergen Reaction Noted   Aspirin Nausea Only 05/08/2011   Caffeine  Other (See Comments) 05/08/2011   Ibuprofen  05/01/2017   Penicillins Hives 05/08/2011    Family History  Problem Relation Age of Onset   Alcohol abuse Mother    Asthma Mother    COPD Mother    Depression Mother    Drug abuse Mother    Miscarriages / Stillbirths Mother    Anxiety disorder Mother    Depression Brother    Learning disabilities Brother    Depression Daughter    Miscarriages / Stillbirths Daughter    Birth defects Son        spinabifida, scoliosis   Kidney disease Son    Diabetes Maternal Grandmother    Heart disease Maternal Grandmother    Hyperlipidemia Maternal Grandmother    Hypertension Maternal Grandmother    Miscarriages / Stillbirths Maternal Grandmother    Asthma Maternal Grandfather    COPD Maternal Grandfather    Cancer Paternal Grandmother    Birth defects Brother        born with one kidney   Depression Brother    Learning  disabilities Brother        dislexia    Social History[1]   Review of Systems:    Constitutional: No weight loss, fever, chills Cardiovascular: No chest pain Respiratory: No SOB  Gastrointestinal: See HPI and otherwise negative   Physical Exam:  Vital signs: BP 122/70   Pulse 69   Ht 5' 1 (1.549 m)   Wt 184 lb 4 oz (83.6 kg)   SpO2 97%   BMI 34.81 kg/m   Constitutional: Pleasant, obese female in NAD, alert and cooperative Head:  Normocephalic and atraumatic.  Respiratory: Respirations even and unlabored. Lungs clear to auscultation bilaterally.  No wheezes, crackles, or rhonchi.  Cardiovascular:  Regular rate and rhythm. No murmurs. No peripheral edema. Gastrointestinal:  Soft, nondistended, nontender. No rebound or guarding. Normal bowel sounds. No appreciable masses or hepatomegaly. Rectal:  Perianal skin tag, small nonbleeding nonthrombosed external hemorrhoid/prolapsed internal hemorrhoid.  No fissures.  No visible bleeding.  Chaperone present for exam. Neurologic:  Alert and oriented x4;  grossly normal neurologically.  Skin:   Dry and intact without significant lesions or rashes. Psychiatric: Oriented to person, place and time. Demonstrates good judgement and reason without abnormal affect or behaviors.   RELEVANT LABS AND IMAGING: CBC    Component Value Date/Time   WBC 9.2 01/19/2024 2030   RBC 4.70 01/19/2024 2030   HGB 14.3 01/19/2024 2034   HGB 14.5 04/18/2020 1049   HCT 42.0 01/19/2024 2034   HCT 45.2 04/18/2020 1049   PLT 280 01/19/2024 2030   PLT 230 04/18/2020 1049   MCV 90.0 01/19/2024 2030   MCV 93 04/18/2020 1049   MCH 29.8 01/19/2024 2030   MCHC 33.1 01/19/2024 2030   RDW 14.0 01/19/2024 2030   RDW 13.1 04/18/2020 1049   LYMPHSABS 2.6 02/20/2021 1613   LYMPHSABS 2.0 04/18/2020 1049   MONOABS 0.6 02/20/2021 1613   EOSABS 0.2 02/20/2021 1613   EOSABS 0.1 04/18/2020 1049   BASOSABS 0.0 02/20/2021 1613   BASOSABS 0.1 04/18/2020 1049    CMP      Component Value Date/Time   NA 139 01/19/2024 2034   NA 142 04/18/2020 1049   K 3.3 (L) 01/19/2024 2034   CL 103 01/19/2024 2034   CO2 25 01/19/2024 2030   GLUCOSE 98 01/19/2024 2034   BUN 10 01/19/2024 2034   BUN 12 04/18/2020 1049   CREATININE 0.70 01/19/2024 2034   CALCIUM 9.1 01/19/2024 2030   PROT 6.5 01/19/2024 2030   PROT 6.7 04/18/2020 1049   ALBUMIN 3.8 01/19/2024 2030   ALBUMIN 4.2 04/18/2020 1049   AST 20 01/19/2024 2030   ALT 11 01/19/2024 2030   ALKPHOS 68 01/19/2024 2030   BILITOT 0.6 01/19/2024 2030   BILITOT 0.2 04/18/2020 1049   GFRNONAA >60 01/19/2024 2030   GFRAA 113 04/18/2020 1049     Assessment/Plan:   Assessment & Plan  Change in bowel habits Change in stool caliber Constipation Hemorrhoids History of colon polyps (patient reported) Rectal bleeding  Intermittent thin stools and need for manual assistance during bowel movements. Concern for possible rectal mass due to change in stool caliber and no recent colonoscopy. Current symptoms warrant further evaluation. Intermittent blood in stool, possibly due to hemorrhoids which are seen on exam today. Long-standing issue with no recent changes in symptoms.  Last colonoscopy 2018 was normal with no polyps and recommended recall in 10 years.  Patient reports history of polyps on previous colonoscopies.  Recommended scheduling colonoscopy.  Patient does have concerns about insurance coverage for this.  States that she either has already lost Medicaid coverage or will be losing it by the end of the year.  Will have coverage with her job starting in February, and wants to defer procedures until that time if not covered by insurance now. Discussed risks of postponing procedure including delay of care if there is a more concerning issue such as a mass going on.  Patient is understanding.  - Will tentatively schedule colonoscopy, there is an opening next week. I thoroughly discussed the procedure with the  patient to include nature of the procedure, alternatives, benefits, and risks (including but not limited to bleeding, infection, perforation, anesthesia/cardiac/pulmonary complications). Patient verbalized understanding and gave verbal consent to proceed with procedure.  - Patient will call her insurance company to determine if she still has coverage and will let us  know. - I will set reminder to reach out to patient tomorrow and follow up on  this.  Epigastric pain History of stomach ulcer GERD History of gastric ulcers with current constant burning abdominal pain and nausea since PPI was discontinued. Recent change to Famotidine  due to bone health concerns. She denies diagnosis of osteoporosis.  She has been having some intermittent black stools, unable to tell me how frequently this occurs. No dysphagia.   - Labs today: CBC, CMP - Start omeprazole  40 mg daily - Continue famotidine  - Will discuss with Dr. Avram about possibly adding on EGD to colonoscopy   Camie Furbish, PA-C [facility] 08/20/2024, 11:18 AM  Patient Care Team: Loring Tanda Mae, MD as PCP - General (Family Medicine)       [1]  Social History Tobacco Use   Smoking status: Never   Smokeless tobacco: Never  Vaping Use   Vaping status: Never Used  Substance Use Topics   Alcohol use: Yes    Comment: couple times a week.  None in a month   Drug use: No

## 2024-08-20 NOTE — Patient Instructions (Signed)
 We have sent the following medications to your pharmacy for you to pick up at your convenience:  Omeprazole   Your provider has requested that you go to the basement level for lab work before leaving today. Press B on the elevator. The lab is located at the first door on the left as you exit the elevator.  You have been scheduled for a colonoscopy. Please follow written instructions given to you at your visit today.   If you use inhalers (even only as needed), please bring them with you on the day of your procedure.  DO NOT TAKE 7 DAYS PRIOR TO TEST- Trulicity (dulaglutide) Ozempic, Wegovy (semaglutide) Mounjaro, Zepbound (tirzepatide) Bydureon Bcise (exanatide extended release)  DO NOT TAKE 1 DAY PRIOR TO YOUR TEST Rybelsus (semaglutide) Adlyxin (lixisenatide) Victoza (liraglutide) Byetta (exanatide) ___________________________________________________________________________  _______________________________________________________  If your blood pressure at your visit was 140/90 or greater, please contact your primary care physician to follow up on this.  _______________________________________________________  If you are age 56 or older, your body mass index should be between 23-30. Your Body mass index is 34.81 kg/m. If this is out of the aforementioned range listed, please consider follow up with your Primary Care Provider.  If you are age 5 or younger, your body mass index should be between 19-25. Your Body mass index is 34.81 kg/m. If this is out of the aformentioned range listed, please consider follow up with your Primary Care Provider.   ________________________________________________________  The [facility] providers would like to encourage you to use MYCHART to communicate with providers for non-urgent requests or questions.  Due to long hold times on the telephone, sending your provider a message by [facility] may be a faster and more efficient way to get a response.   Please allow 48 business hours for a response.  Please remember that this is for non-urgent requests.  _______________________________________________________  [facility] is using a team-based approach to care.  Your team is made up of your doctor and two to three APPS. Our APPS (Nurse Practitioners and Physician Assistants) work with your physician to ensure care continuity for you. They are fully qualified to address your health concerns and develop a treatment plan. They communicate directly with your gastroenterologist to care for you. Seeing the Advanced Practice Practitioners on your physician's team can help you by facilitating care more promptly, often allowing for earlier appointments, access to diagnostic testing, procedures, and other specialty referrals.

## 2024-08-21 ENCOUNTER — Telehealth: Payer: Self-pay | Admitting: Gastroenterology

## 2024-08-21 ENCOUNTER — Other Ambulatory Visit: Payer: Self-pay

## 2024-08-21 DIAGNOSIS — Z8711 Personal history of peptic ulcer disease: Secondary | ICD-10-CM

## 2024-08-21 DIAGNOSIS — K219 Gastro-esophageal reflux disease without esophagitis: Secondary | ICD-10-CM

## 2024-08-21 DIAGNOSIS — R1013 Epigastric pain: Secondary | ICD-10-CM

## 2024-08-21 NOTE — Telephone Encounter (Signed)
 Spoke with charge nurse of [facility], Sarah. EGD added to the scheduled colonoscopy for 08/24/24. Pre-certification team notified. No prior authorization is needed.

## 2024-08-21 NOTE — Telephone Encounter (Signed)
 Please contact patient and see if she has had any clarification regarding her insurance coverage.  We can also reach out to the precertification team to see if patient currently has Medicaid coverage. Dr. Avram has told me he is okay with adding on an EGD to her colonoscopy as it is scheduled at 3 PM on 12/15.  Please reach out to [facility] and see if they can add this on. Need to get this sorted by end of day since procedure is scheduled for Monday.  Thank you!

## 2024-08-24 ENCOUNTER — Encounter: Admitting: Internal Medicine

## 2024-09-15 ENCOUNTER — Emergency Department (HOSPITAL_COMMUNITY)
Admission: EM | Admit: 2024-09-15 | Discharge: 2024-09-16 | Discharge status: Against Medical Advice | Attending: Emergency Medicine | Admitting: Emergency Medicine

## 2024-09-15 ENCOUNTER — Ambulatory Visit: Admitting: Gastroenterology

## 2024-09-15 DIAGNOSIS — R112 Nausea with vomiting, unspecified: Secondary | ICD-10-CM | POA: Insufficient documentation

## 2024-09-15 DIAGNOSIS — Z5321 Procedure and treatment not carried out due to patient leaving prior to being seen by health care provider: Secondary | ICD-10-CM | POA: Diagnosis not present

## 2024-09-15 DIAGNOSIS — R1013 Epigastric pain: Secondary | ICD-10-CM | POA: Diagnosis not present

## 2024-09-15 DIAGNOSIS — E860 Dehydration: Secondary | ICD-10-CM | POA: Diagnosis not present

## 2024-09-15 DIAGNOSIS — R197 Diarrhea, unspecified: Secondary | ICD-10-CM | POA: Diagnosis not present

## 2024-09-15 LAB — COMPREHENSIVE METABOLIC PANEL WITH GFR
ALT: 9 U/L (ref 0–44)
AST: 20 U/L (ref 15–41)
Albumin: 4.9 g/dL (ref 3.5–5.0)
Alkaline Phosphatase: 89 U/L (ref 38–126)
Anion gap: 15 (ref 5–15)
BUN: 12 mg/dL (ref 6–20)
CO2: 21 mmol/L — ABNORMAL LOW (ref 22–32)
Calcium: 9.7 mg/dL (ref 8.9–10.3)
Chloride: 102 mmol/L (ref 98–111)
Creatinine, Ser: 0.83 mg/dL (ref 0.44–1.00)
GFR, Estimated: >60 mL/min
Glucose, Bld: 105 mg/dL — ABNORMAL HIGH (ref 70–99)
Potassium: 4.1 mmol/L (ref 3.5–5.1)
Sodium: 138 mmol/L (ref 135–145)
Total Bilirubin: 0.5 mg/dL (ref 0.0–1.2)
Total Protein: 8.2 g/dL — ABNORMAL HIGH (ref 6.5–8.1)

## 2024-09-15 LAB — CBC WITH DIFFERENTIAL/PLATELET
Abs Immature Granulocytes: 0.04 K/uL (ref 0.00–0.07)
Basophils Absolute: 0.1 K/uL (ref 0.0–0.1)
Basophils Relative: 1 %
Eosinophils Absolute: 0.1 K/uL (ref 0.0–0.5)
Eosinophils Relative: 1 %
HCT: 50.3 % — ABNORMAL HIGH (ref 36.0–46.0)
Hemoglobin: 16.6 g/dL — ABNORMAL HIGH (ref 12.0–15.0)
Immature Granulocytes: 0 %
Lymphocytes Relative: 16 %
Lymphs Abs: 2 K/uL (ref 0.7–4.0)
MCH: 29.6 pg (ref 26.0–34.0)
MCHC: 33 g/dL (ref 30.0–36.0)
MCV: 89.8 fL (ref 80.0–100.0)
Monocytes Absolute: 0.8 K/uL (ref 0.1–1.0)
Monocytes Relative: 6 %
Neutro Abs: 9.8 K/uL — ABNORMAL HIGH (ref 1.7–7.7)
Neutrophils Relative %: 76 %
Platelets: 287 K/uL (ref 150–400)
RBC: 5.6 MIL/uL — ABNORMAL HIGH (ref 3.87–5.11)
RDW: 13.5 % (ref 11.5–15.5)
WBC: 12.8 K/uL — ABNORMAL HIGH (ref 4.0–10.5)
nRBC: 0 % (ref 0.0–0.2)

## 2024-09-15 LAB — LIPASE, BLOOD: Lipase: 21 U/L (ref 11–51)

## 2024-09-15 MED ORDER — METOCLOPRAMIDE HCL 5 MG/ML IJ SOLN
10.0000 mg | Freq: Once | INTRAMUSCULAR | Status: AC
  Administered 2024-09-15: 10 mg via INTRAVENOUS
  Filled 2024-09-15: qty 2

## 2024-09-15 NOTE — ED Notes (Signed)
 Pt requested IV be removed so that she could leave. Pt stated I'm going to another hospital. Triage Rn advised to remove IV. Iv was removed, and Pt left ED lobby.

## 2024-09-15 NOTE — ED Provider Triage Note (Signed)
 Emergency Medicine Provider Triage Evaluation Note  Kelli Allison , a 59 y.o. female  was evaluated in triage.  Pt complains of vomiting.  Patient reports about 2 days of persistent nausea and vomiting with some diarrhea.  States that she has tried oral intake without significant improvement or ability to hold down any of this.  She seen her PCP today, given IM Zofran  and had multiple episodes of vomiting afterwards, she advised to come into the emergency department for evaluation for concerns of dehydration.  Review of Systems  Positive: As above Negative: As above  Physical Exam  BP 110/79 (BP Location: Left Arm)   Pulse 80   Temp 98.4 F (36.9 C) (Oral)   Resp 18   SpO2 95%  Gen:   Awake, no distress  Resp:  Normal effort  MSK:   Moves extremities without difficulty  Other:  Tenderness towards the epigastrium  Medical Decision Making  Medically screening exam initiated at 4:40 PM.  Appropriate orders placed.  Kelli Allison was informed that the remainder of the evaluation will be completed by another provider, this initial triage assessment does not replace that evaluation, and the importance of remaining in the ED until their evaluation is complete.     Kelli Allison A, PA-C 09/15/24 1641

## 2024-09-15 NOTE — ED Triage Notes (Signed)
 BIB EMS for n/v/d from pcp for being dehydrated. Given IM Zofran  by pcp.

## 2024-09-17 ENCOUNTER — Encounter (HOSPITAL_BASED_OUTPATIENT_CLINIC_OR_DEPARTMENT_OTHER): Payer: Self-pay | Admitting: Emergency Medicine

## 2024-09-17 ENCOUNTER — Other Ambulatory Visit: Payer: Self-pay

## 2024-09-17 ENCOUNTER — Other Ambulatory Visit (HOSPITAL_BASED_OUTPATIENT_CLINIC_OR_DEPARTMENT_OTHER): Payer: Self-pay

## 2024-09-17 ENCOUNTER — Emergency Department (HOSPITAL_BASED_OUTPATIENT_CLINIC_OR_DEPARTMENT_OTHER)
Admission: EM | Admit: 2024-09-17 | Discharge: 2024-09-17 | Disposition: A | Discharge status: Home / Self Care | Attending: Emergency Medicine | Admitting: Emergency Medicine

## 2024-09-17 ENCOUNTER — Telehealth: Payer: Self-pay | Admitting: Internal Medicine

## 2024-09-17 DIAGNOSIS — Z79899 Other long term (current) drug therapy: Secondary | ICD-10-CM | POA: Diagnosis not present

## 2024-09-17 DIAGNOSIS — E039 Hypothyroidism, unspecified: Secondary | ICD-10-CM | POA: Insufficient documentation

## 2024-09-17 DIAGNOSIS — R1084 Generalized abdominal pain: Secondary | ICD-10-CM | POA: Diagnosis not present

## 2024-09-17 DIAGNOSIS — R112 Nausea with vomiting, unspecified: Secondary | ICD-10-CM | POA: Insufficient documentation

## 2024-09-17 LAB — COMPREHENSIVE METABOLIC PANEL WITH GFR
ALT: 8 U/L (ref 0–44)
AST: 18 U/L (ref 15–41)
Albumin: 4.2 g/dL (ref 3.5–5.0)
Alkaline Phosphatase: 84 U/L (ref 38–126)
Anion gap: 11 (ref 5–15)
BUN: 9 mg/dL (ref 6–20)
CO2: 23 mmol/L (ref 22–32)
Calcium: 9.5 mg/dL (ref 8.9–10.3)
Chloride: 104 mmol/L (ref 98–111)
Creatinine, Ser: 0.73 mg/dL (ref 0.44–1.00)
GFR, Estimated: >60 mL/min
Glucose, Bld: 97 mg/dL (ref 70–99)
Potassium: 3.9 mmol/L (ref 3.5–5.1)
Sodium: 139 mmol/L (ref 135–145)
Total Bilirubin: 0.3 mg/dL (ref 0.0–1.2)
Total Protein: 6.8 g/dL (ref 6.5–8.1)

## 2024-09-17 LAB — CBC WITH DIFFERENTIAL/PLATELET
Abs Immature Granulocytes: 0.03 K/uL (ref 0.00–0.07)
Basophils Absolute: 0 K/uL (ref 0.0–0.1)
Basophils Relative: 1 %
Eosinophils Absolute: 0.1 K/uL (ref 0.0–0.5)
Eosinophils Relative: 1 %
HCT: 41.6 % (ref 36.0–46.0)
Hemoglobin: 14.4 g/dL (ref 12.0–15.0)
Immature Granulocytes: 0 %
Lymphocytes Relative: 24 %
Lymphs Abs: 2.1 K/uL (ref 0.7–4.0)
MCH: 30.4 pg (ref 26.0–34.0)
MCHC: 34.6 g/dL (ref 30.0–36.0)
MCV: 87.9 fL (ref 80.0–100.0)
Monocytes Absolute: 0.6 K/uL (ref 0.1–1.0)
Monocytes Relative: 7 %
Neutro Abs: 5.8 K/uL (ref 1.7–7.7)
Neutrophils Relative %: 67 %
Platelets: 232 K/uL (ref 150–400)
RBC: 4.73 MIL/uL (ref 3.87–5.11)
RDW: 13.3 % (ref 11.5–15.5)
WBC: 8.7 K/uL (ref 4.0–10.5)
nRBC: 0 % (ref 0.0–0.2)

## 2024-09-17 LAB — URINALYSIS, ROUTINE W REFLEX MICROSCOPIC
Bilirubin Urine: NEGATIVE
Glucose, UA: NEGATIVE mg/dL
Hgb urine dipstick: NEGATIVE
Ketones, ur: NEGATIVE mg/dL
Leukocytes,Ua: NEGATIVE
Nitrite: NEGATIVE
Protein, ur: NEGATIVE mg/dL
Specific Gravity, Urine: 1.01 (ref 1.005–1.030)
pH: 5 (ref 5.0–8.0)

## 2024-09-17 LAB — LIPASE, BLOOD: Lipase: 27 U/L (ref 11–51)

## 2024-09-17 MED ORDER — SODIUM CHLORIDE 0.9 % IV BOLUS
1000.0000 mL | Freq: Once | INTRAVENOUS | Status: AC
  Administered 2024-09-17: 1000 mL via INTRAVENOUS

## 2024-09-17 MED ORDER — ONDANSETRON HCL 4 MG/2ML IJ SOLN
4.0000 mg | Freq: Once | INTRAMUSCULAR | Status: AC
  Administered 2024-09-17: 4 mg via INTRAVENOUS
  Filled 2024-09-17: qty 2

## 2024-09-17 MED ORDER — DICYCLOMINE HCL 10 MG PO CAPS
10.0000 mg | ORAL_CAPSULE | Freq: Once | ORAL | Status: AC
  Administered 2024-09-17: 10 mg via ORAL
  Filled 2024-09-17: qty 1

## 2024-09-17 MED ORDER — MORPHINE SULFATE (PF) 2 MG/ML IV SOLN
2.0000 mg | Freq: Once | INTRAVENOUS | Status: AC
  Administered 2024-09-17: 2 mg via INTRAVENOUS
  Filled 2024-09-17: qty 1

## 2024-09-17 MED ORDER — ONDANSETRON 4 MG PO TBDP
4.0000 mg | ORAL_TABLET | Freq: Three times a day (TID) | ORAL | 0 refills | Status: AC | PRN
  Filled 2024-09-17: qty 20, 7d supply, fill #0

## 2024-09-17 MED ORDER — DICYCLOMINE HCL 20 MG PO TABS
20.0000 mg | ORAL_TABLET | Freq: Two times a day (BID) | ORAL | 0 refills | Status: AC | PRN
  Filled 2024-09-17: qty 20, 10d supply, fill #0

## 2024-09-17 NOTE — ED Notes (Addendum)
 Urine sample sent to lab. Pt reports feeling nauseas after drinking gatorade. PA made aware.

## 2024-09-17 NOTE — ED Triage Notes (Signed)
 Reports generalized abd pain with n/v/d x 3 days. Seen at [facility] for same and given fluids and meds. States symptoms improved but returned last night.

## 2024-09-17 NOTE — Telephone Encounter (Signed)
 Said she was told she had a stomach bug. She has nausea and vomiting with diarrhea. She feels she is struggling to retain PO's and feels very dehydrated. Taking famotidine  and omeprazole  as ordered. Does not have any anti-nausea medications. She is on her way to the ER. Feels she needs fluids. She has spoken with her PCP who told her if she could not urinate, she should go to the ER. Encouraged to follow that recommendation and to be patient as the ER triage works her through the process. (Left w/o being seen recently)

## 2024-09-17 NOTE — Telephone Encounter (Signed)
 Call from pt regarding hospital follow up. Pt stated she recently when to ED and was informed to follow up here. Pt scheduled on 01/13 @9 :30 Pt stated she is experiencing pain/burning in stomach and throat, and unable to urinate. Pt requesting sooner visit or medical advice. Please advise. Thank you.

## 2024-09-17 NOTE — ED Notes (Signed)
 ED Provider at bedside.

## 2024-09-17 NOTE — ED Provider Notes (Signed)
 "  EMERGENCY DEPARTMENT AT [facility] Provider Note   CSN: [id number] Arrival date & time: 09/17/24  1123     Patient presents with: Abdominal Pain   Kelli Allison is a 59 y.o. female.   59 year old female presenting with generalized abdominal pain/nausea/vomiting/diarrhea since Sunday.  Patient notes that she did attend a funeral/potluck earlier in the day on Sunday, however she does not believe that her symptoms are due to a foodborne illness as no other individuals were sick after this event.  Patient endorses frequent episodes of nausea/vomiting/diarrhea since the onset of her symptoms, patient notes that her symptoms seem to improve early Tuesday and she was able to eat/drink, however last night her symptoms returned.  Describes generalized abdominal pain that is constant and does not localize.  Reports that she is feeling dehydrated and has not urinated since last night despite the fact that she is continue to drink small sips of Gatorade/water.  Denies melena/hematochezia, hematemesis, fever, cough, congestion, chest pain, shortness of breath.  History of cholecystectomy.   Abdominal Pain      Prior to Admission medications  Medication Sig Start Date End Date Taking? Authorizing Provider  cyclobenzaprine  (FLEXERIL ) 10 MG tablet Take 1 tablet (10 mg total) by mouth 2 (two) times daily as needed for up to 20 doses for muscle spasms. 01/19/24   Cottie Donnice PARAS, MD  famotidine  (PEPCID ) 20 MG tablet Take 1-2 tablets by mouth daily. 05/05/24   [provider]  levothyroxine  (SYNTHROID , LEVOTHROID) 75 MCG tablet Take 1 tablet (75 mcg total) by mouth daily before breakfast. 12/09/18   Zollie Lowers, MD  meloxicam (MOBIC) 15 MG tablet Take 15 mg by mouth. 05/26/24   [provider]  moxifloxacin (VIGAMOX) 0.5 % ophthalmic solution Apply 1 drop to eye.    [provider]  Multiple Vitamin (MULTIVITAMIN) tablet Take 1 tablet by mouth daily.     [provider]  neomycin-polymyxin-hydrocortisone (CORTISPORIN) 3.5-10000-1 OTIC suspension 4 drops 2 (two) times daily. 06/02/24   [provider]  ofloxacin (OCUFLOX) 0.3 % ophthalmic solution 1 drop 4 (four) times daily. 06/15/24   [provider]  omeprazole  (PRILOSEC) 40 MG capsule Take 1 capsule (40 mg total) by mouth daily. Take every morning on empty stomach 30 to 60 minutes before eating or drinking 08/20/24 09/19/24  Arletta, Sara E, PA-C  ondansetron  (ZOFRAN  ODT) 4 MG disintegrating tablet Take 1 tablet (4 mg total) by mouth every 8 (eight) hours as needed for nausea or vomiting. 08/24/19   Khatri, Hina, PA-C  phentermine (ADIPEX-P) 37.5 MG tablet Take 37.5 mg by mouth daily. 05/05/24   [provider]  prednisoLONE acetate (PRED FORTE) 1 % ophthalmic suspension 1 drop 4 (four) times daily. 06/15/24   [provider]  traMADol  (ULTRAM ) 50 MG tablet Take 50 mg by mouth. 04/06/24   [provider]  traZODone (DESYREL) 150 MG tablet Take 150 mg by mouth at bedtime. 10/06/23   [provider]  vitamin B-12 (CYANOCOBALAMIN) 1000 MCG tablet Take 1,000 mcg by mouth daily.    [provider]  Vitamin D , Ergocalciferol , (DRISDOL ) 1.25 MG (50000 UNIT) CAPS capsule Take 1 capsule (50,000 Units total) by mouth every 7 (seven) days. 05/02/20   [place] Adelita PENNER, MD    Allergies: Aspirin, Caffeine , Ibuprofen, and Penicillins    Review of Systems  Gastrointestinal:  Positive for abdominal pain.    Updated Vital Signs  Vitals:   09/17/24 1132  BP: 103/81  Pulse:  87  Resp: 18  Temp: 98.7 F (37.1 C)  SpO2: 96%     Physical Exam Vitals and nursing note reviewed.  Constitutional:      General: She is not in acute distress. HENT:     Head: Normocephalic and atraumatic.     Mouth/Throat:     Mouth: Mucous membranes are dry.  Eyes:     Extraocular Movements: Extraocular movements intact.     Pupils: Pupils are equal,  round, and reactive to light.  Cardiovascular:     Rate and Rhythm: Normal rate and regular rhythm.     Heart sounds: Normal heart sounds.  Pulmonary:     Effort: Pulmonary effort is normal.     Breath sounds: Normal breath sounds.  Abdominal:     General: Bowel sounds are normal.     Palpations: Abdomen is soft.     Tenderness: There is no abdominal tenderness. There is no guarding.  Musculoskeletal:     Cervical back: Normal range of motion.     Comments: Moves all extremities spontaneously without difficulty  Skin:    General: Skin is warm and dry.  Neurological:     Mental Status: She is alert and oriented to person, place, and time.     (all labs ordered are listed, but only abnormal results are displayed) Labs Reviewed  COMPREHENSIVE METABOLIC PANEL WITH GFR  LIPASE, BLOOD  CBC WITH DIFFERENTIAL/PLATELET  URINALYSIS, ROUTINE W REFLEX MICROSCOPIC    EKG: None  Radiology: No results found.   Procedures   Medications Ordered in the ED  sodium chloride  0.9 % bolus 1,000 mL (1,000 mLs Intravenous New Bag/Given 09/17/24 1228)  ondansetron  (ZOFRAN ) injection 4 mg (4 mg Intravenous Given 09/17/24 1229)  morphine  (PF) 2 MG/ML injection 2 mg (2 mg Intravenous Given 09/17/24 1236)  dicyclomine  (BENTYL ) capsule 10 mg (10 mg Oral Given 09/17/24 1425)  ondansetron  (ZOFRAN ) injection 4 mg (4 mg Intravenous Given 09/17/24 1422)                                    Medical Decision Making This patient presents to the ED for concern of abdominal pain/nausea/vomiting/diarrhea, this involves an extensive number of treatment options, and is a complaint that carries with it a high risk of complications and morbidity.  The differential diagnosis includes viral gastroenteritis, pancreatitis, appendicitis, diverticulitis   Co morbidities that complicate the patient evaluation  Hypothyroidism   Additional history obtained:  Additional history obtained from record review External records  from outside source obtained and reviewed including ED note from [facility] 1/6   Lab Tests:  I Ordered, and personally interpreted labs.  The pertinent results include: CBC within normal limits.  CMP within normal limits.  Lipase within normal limits, 27. UA WNL.    Cardiac Monitoring: / EKG:  The patient was maintained on a cardiac monitor.  I personally viewed and interpreted the cardiac monitored which showed an underlying rhythm of: NSR   Problem List / ED Course / Critical interventions / Medication management   I ordered medication including Zofran  for nausea, morphine  for pain, IV fluids for rehydration, Bentyl  for abdominal pain Reevaluation of the patient after these medicines showed that the patient improved I have reviewed the patients home medicines and have made adjustments as needed   Test / Admission - Considered:  Physical exam is notable as above, patient with dry mucous membranes and clinically  appears dehydrated, reports poor p.o. intake for the last 24 hours or so, has not passed urine since last night.  Abdomen is soft and nontender on exam, patient complains of diffuse abdominal discomfort but reports it is no worse with palpation, describes as constant.  Patient symptoms today are favoring a viral gastroenteritis, however will proceed with labs and reassess after patient receives antiemetics/analgesics/IV fluids.  Vitals are reassuring, she is without fever or tachycardia. Labs are unremarkable as above.  Patient had good response to administration of morphine /Zofran , patient reports recurrence of abdominal pain/nausea so will switch to Bentyl  with initial dose of Zofran  given.  Patient able to tolerate p.o., has been drinking Gatorade without subsequent vomiting. Suspect that ongoing symptoms are likely due to a viral gastroenteritis, given reassuring physical exam and labs as above I do not feel that CT imaging is warranted at this time.  Will prescribe Zofran  to  be used as needed for nausea, will also prescribe Bentyl  to be used as needed for abdominal cramping.  Strict return precautions discussed, patient voiced understanding and is in agreement this plan, she is appropriate for discharge at this time.     Amount and/or Complexity of Data Reviewed Labs: ordered.  Risk Prescription drug management.        Final diagnoses:  Nausea and vomiting, unspecified vomiting type    ED Discharge Orders          Ordered    dicyclomine  (BENTYL ) 20 MG tablet  2 times daily PRN        09/17/24 1450    ondansetron  (ZOFRAN -ODT) 4 MG disintegrating tablet  Every 8 hours PRN        09/17/24 1450               Glendia Rocky SAILOR, [place] 09/17/24 1451    Cottie Donnice PARAS, MD 09/17/24 1459  "

## 2024-09-17 NOTE — Discharge Instructions (Addendum)
 Your symptoms today are likely due to a viral gastroenteritis, continue to push fluids to avoid dehydration, continue to eat a gentle diet with foods like bananas/rice/applesauce/toast.  Start Bentyl  and take 1 tablet by mouth up to twice daily as needed for abdominal pain/spasms.  Start Zofran  and take 1 tablet by mouth every 8 hours as needed for nausea/vomiting.  Return to the emergency department if your symptoms worsen.

## 2024-09-17 NOTE — ED Notes (Signed)
 This NT asked pt again if she thought she would be able to provide a urine sample. Patient states she is still unable to urinate to provide a sample.

## 2024-09-17 NOTE — ED Notes (Signed)
 Reviewed AVS/discharge instructions with patient. Time allotted for and all questions answered. Patient is agreeable for d/c and escorted to ED exit by staff.

## 2024-09-20 ENCOUNTER — Encounter (HOSPITAL_BASED_OUTPATIENT_CLINIC_OR_DEPARTMENT_OTHER): Payer: Self-pay | Admitting: *Deleted

## 2024-09-20 ENCOUNTER — Emergency Department (HOSPITAL_BASED_OUTPATIENT_CLINIC_OR_DEPARTMENT_OTHER)
Admission: EM | Admit: 2024-09-20 | Discharge: 2024-09-20 | Disposition: A | Discharge status: Home / Self Care | Attending: Emergency Medicine | Admitting: Emergency Medicine

## 2024-09-20 ENCOUNTER — Emergency Department (HOSPITAL_BASED_OUTPATIENT_CLINIC_OR_DEPARTMENT_OTHER)

## 2024-09-20 ENCOUNTER — Other Ambulatory Visit: Payer: Self-pay

## 2024-09-20 DIAGNOSIS — B349 Viral infection, unspecified: Secondary | ICD-10-CM | POA: Diagnosis not present

## 2024-09-20 DIAGNOSIS — R059 Cough, unspecified: Secondary | ICD-10-CM | POA: Diagnosis present

## 2024-09-20 LAB — COMPREHENSIVE METABOLIC PANEL WITH GFR
ALT: 9 U/L (ref 0–44)
AST: 21 U/L (ref 15–41)
Albumin: 4.2 g/dL (ref 3.5–5.0)
Alkaline Phosphatase: 85 U/L (ref 38–126)
Anion gap: 11 (ref 5–15)
BUN: 9 mg/dL (ref 6–20)
CO2: 24 mmol/L (ref 22–32)
Calcium: 9.8 mg/dL (ref 8.9–10.3)
Chloride: 103 mmol/L (ref 98–111)
Creatinine, Ser: 0.67 mg/dL (ref 0.44–1.00)
GFR, Estimated: >60 mL/min
Glucose, Bld: 127 mg/dL — ABNORMAL HIGH (ref 70–99)
Potassium: 4.2 mmol/L (ref 3.5–5.1)
Sodium: 139 mmol/L (ref 135–145)
Total Bilirubin: 0.3 mg/dL (ref 0.0–1.2)
Total Protein: 7.2 g/dL (ref 6.5–8.1)

## 2024-09-20 LAB — CBC WITH DIFFERENTIAL/PLATELET
Abs Immature Granulocytes: 0.06 K/uL (ref 0.00–0.07)
Basophils Absolute: 0 K/uL (ref 0.0–0.1)
Basophils Relative: 0 %
Eosinophils Absolute: 0 K/uL (ref 0.0–0.5)
Eosinophils Relative: 0 %
HCT: 42.3 % (ref 36.0–46.0)
Hemoglobin: 14.5 g/dL (ref 12.0–15.0)
Immature Granulocytes: 1 %
Lymphocytes Relative: 8 %
Lymphs Abs: 0.9 K/uL (ref 0.7–4.0)
MCH: 29.7 pg (ref 26.0–34.0)
MCHC: 34.3 g/dL (ref 30.0–36.0)
MCV: 86.7 fL (ref 80.0–100.0)
Monocytes Absolute: 0.1 K/uL (ref 0.1–1.0)
Monocytes Relative: 1 %
Neutro Abs: 10.2 K/uL — ABNORMAL HIGH (ref 1.7–7.7)
Neutrophils Relative %: 90 %
Platelets: 248 K/uL (ref 150–400)
RBC: 4.88 MIL/uL (ref 3.87–5.11)
RDW: 13.3 % (ref 11.5–15.5)
WBC: 11.3 K/uL — ABNORMAL HIGH (ref 4.0–10.5)
nRBC: 0 % (ref 0.0–0.2)

## 2024-09-20 LAB — RESP PANEL BY RT-PCR (RSV, FLU A&B, COVID)  RVPGX2
Influenza A by PCR: NEGATIVE
Influenza B by PCR: NEGATIVE
Resp Syncytial Virus by PCR: NEGATIVE
SARS Coronavirus 2 by RT PCR: NEGATIVE

## 2024-09-20 LAB — MONONUCLEOSIS SCREEN: Mono Screen: NEGATIVE

## 2024-09-20 LAB — LIPASE, BLOOD: Lipase: 32 U/L (ref 11–51)

## 2024-09-20 MED ORDER — LACTATED RINGERS IV BOLUS
1000.0000 mL | Freq: Once | INTRAVENOUS | Status: AC
  Administered 2024-09-20: 1000 mL via INTRAVENOUS

## 2024-09-20 MED ORDER — LACTATED RINGERS IV SOLN: INTRAVENOUS | Status: DC

## 2024-09-20 MED ORDER — METOCLOPRAMIDE HCL 5 MG/ML IJ SOLN
10.0000 mg | Freq: Once | INTRAMUSCULAR | Status: AC
  Administered 2024-09-20: 10 mg via INTRAVENOUS
  Filled 2024-09-20: qty 2

## 2024-09-20 NOTE — ED Triage Notes (Signed)
 Pt was seen for viral illness with n/v/d on 1/8 and is here due still feeling sick.  She states that the diarrhea has stopped but she continued to have fatigue and nausea after eating as well as some congestion and cough. No sob.  Pt is able to keep down all liquids. Pt is speaking in full sentences

## 2024-09-20 NOTE — ED Provider Notes (Signed)
 " [place] EMERGENCY DEPARTMENT AT [facility] Provider Note   CSN: [id number] Arrival date & time: 09/20/24  1456     Patient presents with: Cough   Kelli Allison is a 59 y.o. female.   59 year old female presents with recurrent nausea vomiting.  Diagnosed with viral illness several days ago.  Had diarrhea initially which is since resolved.  Denies any fever or chills.  Had emesis x 2 today.  Cannot keep down bananas and liquids.  Notes epigastric burning.  Also endorses fatigue.  Does endorse some cough.  Denies any sore throat or rhinorrhea.  No urinary symptoms.  Does have history of IBS       Prior to Admission medications  Medication Sig Start Date End Date Taking? Authorizing Provider  cyclobenzaprine  (FLEXERIL ) 10 MG tablet Take 1 tablet (10 mg total) by mouth 2 (two) times daily as needed for up to 20 doses for muscle spasms. 01/19/24   Cottie Donnice PARAS, MD  dicyclomine  (BENTYL ) 20 MG tablet Take 1 tablet (20 mg total) by mouth 2 (two) times daily as needed for spasms. 09/17/24   Scott, Rocky SAILOR, PA-C  famotidine  (PEPCID ) 20 MG tablet Take 1-2 tablets by mouth daily. 05/05/24   [provider]  levothyroxine  (SYNTHROID , LEVOTHROID) 75 MCG tablet Take 1 tablet (75 mcg total) by mouth daily before breakfast. 12/09/18   Zollie Lowers, MD  meloxicam (MOBIC) 15 MG tablet Take 15 mg by mouth. 05/26/24   [provider]  moxifloxacin (VIGAMOX) 0.5 % ophthalmic solution Apply 1 drop to eye.    [provider]  Multiple Vitamin (MULTIVITAMIN) tablet Take 1 tablet by mouth daily.    [provider]  neomycin-polymyxin-hydrocortisone (CORTISPORIN) 3.5-10000-1 OTIC suspension 4 drops 2 (two) times daily. 06/02/24   [provider]  ofloxacin (OCUFLOX) 0.3 % ophthalmic solution 1 drop 4 (four) times daily. 06/15/24   [provider]  omeprazole  (PRILOSEC) 40 MG capsule Take 1 capsule (40 mg total) by mouth daily. Take every morning  on empty stomach 30 to 60 minutes before eating or drinking 08/20/24 09/19/24  Arletta, Sara E, PA-C  ondansetron  (ZOFRAN -ODT) 4 MG disintegrating tablet Take 1 tablet (4 mg total) by mouth every 8 (eight) hours as needed for nausea or vomiting. 09/17/24   Glendia Rocky SAILOR, PA-C  phentermine (ADIPEX-P) 37.5 MG tablet Take 37.5 mg by mouth daily. 05/05/24   [provider]  prednisoLONE acetate (PRED FORTE) 1 % ophthalmic suspension 1 drop 4 (four) times daily. 06/15/24   [provider]  traMADol  (ULTRAM ) 50 MG tablet Take 50 mg by mouth. 04/06/24   [provider]  traZODone (DESYREL) 150 MG tablet Take 150 mg by mouth at bedtime. 10/06/23   [provider]  vitamin B-12 (CYANOCOBALAMIN) 1000 MCG tablet Take 1,000 mcg by mouth daily.    [provider]  Vitamin D , Ergocalciferol , (DRISDOL ) 1.25 MG (50000 UNIT) CAPS capsule Take 1 capsule (50,000 Units total) by mouth every 7 (seven) days. 05/02/20   [place] Adelita PENNER, MD    Allergies: Aspirin, Caffeine , Ibuprofen, and Penicillins    Review of Systems  All other systems reviewed and are negative.   Updated Vital Signs BP 127/77   Pulse 99   Temp 98.2 F (36.8 C) (Oral)   Resp 16   SpO2 96%   Physical Exam Vitals and nursing note reviewed.  Constitutional:      General: She is not in acute distress.    Appearance: Normal appearance.  She is well-developed. She is not toxic-appearing.  HENT:     Head: Normocephalic and atraumatic.  Eyes:     General: Lids are normal.     Conjunctiva/sclera: Conjunctivae normal.     Pupils: Pupils are equal, round, and reactive to light.  Neck:     Thyroid : No thyroid  mass.     Trachea: No tracheal deviation.  Cardiovascular:     Rate and Rhythm: Normal rate and regular rhythm.     Heart sounds: Normal heart sounds. No murmur heard.    No gallop.  Pulmonary:     Effort: Pulmonary effort is normal. No respiratory distress.     Breath sounds: Normal breath  sounds. No stridor. No decreased breath sounds, wheezing, rhonchi or rales.  Abdominal:     General: There is no distension.     Palpations: Abdomen is soft.     Tenderness: There is no abdominal tenderness. There is no rebound.  Musculoskeletal:        General: No tenderness. Normal range of motion.     Cervical back: Normal range of motion and neck supple.  Skin:    General: Skin is warm and dry.     Findings: No abrasion or rash.  Neurological:     Mental Status: She is alert and oriented to person, place, and time. Mental status is at baseline.     GCS: GCS eye subscore is 4. GCS verbal subscore is 5. GCS motor subscore is 6.     Cranial Nerves: No cranial nerve deficit.     Sensory: No sensory deficit.     Motor: Motor function is intact.  Psychiatric:        Attention and Perception: Attention normal.        Speech: Speech normal.        Behavior: Behavior normal.     (all labs ordered are listed, but only abnormal results are displayed) Labs Reviewed  RESP PANEL BY RT-PCR (RSV, FLU A&B, COVID)  RVPGX2  CBC WITH DIFFERENTIAL/PLATELET  LIPASE, BLOOD  COMPREHENSIVE METABOLIC PANEL WITH GFR    EKG: None  Radiology: No results found.   Procedures   Medications Ordered in the ED  lactated ringers  bolus 1,000 mL (has no administration in time range)  lactated ringers  infusion (has no administration in time range)  metoCLOPramide  (REGLAN ) injection 10 mg (has no administration in time range)                                    Medical Decision Making Amount and/or Complexity of Data Reviewed Labs: ordered. Radiology: ordered.  Risk Prescription drug management.   Patient is a viral panel including Monospot negative.  Chest chest x-ray without acute findings.  Labs are reassuring.  Will discharge home     Final diagnoses:  None    ED Discharge Orders     None          Dasie Faden, MD 09/20/24 1729  "

## 2024-09-22 ENCOUNTER — Encounter: Payer: Self-pay | Admitting: Internal Medicine

## 2024-09-22 ENCOUNTER — Ambulatory Visit: Admitting: Internal Medicine

## 2024-09-22 VITALS — BP 100/60 | HR 72 | Ht 61.0 in | Wt 181.4 lb

## 2024-09-22 DIAGNOSIS — K625 Hemorrhage of anus and rectum: Secondary | ICD-10-CM

## 2024-09-22 DIAGNOSIS — K529 Noninfective gastroenteritis and colitis, unspecified: Secondary | ICD-10-CM

## 2024-09-22 DIAGNOSIS — R194 Change in bowel habit: Secondary | ICD-10-CM

## 2024-09-22 DIAGNOSIS — K222 Esophageal obstruction: Secondary | ICD-10-CM | POA: Diagnosis not present

## 2024-09-22 DIAGNOSIS — K219 Gastro-esophageal reflux disease without esophagitis: Secondary | ICD-10-CM | POA: Diagnosis not present

## 2024-09-22 MED ORDER — OMEPRAZOLE 40 MG PO CPDR: 40.0000 mg | DELAYED_RELEASE_CAPSULE | Freq: Every day | ORAL | 3 refills | Status: AC

## 2024-09-22 NOTE — Progress Notes (Signed)
 "       Kelli Allison 59 y.o. 1965/12/31 [id number]  Assessment & Plan:   Encounter Diagnoses  Name Primary?   GERD with stricture Yes   Gastroenteritis presumed infectious    Change in bowel habits    Rectal bleeding         Gastroesophageal reflux disease with esophageal stricture Chronic severe symptoms with recurrent dysphagia and heartburn post-omeprazole  discontinuation. Low risk of bone or renal complications with long-term PPI therapy; benefits outweigh risks due to esophageal stricture and failed H2 blocker therapy. - Prescribed omeprazole  40 mg daily, sent prescription to pharmacy. - Educated on poor science behind the concerns about bone loss, renal disease and other problems with PPIs and that these are weak associations and not proven cause-and-effect - Reassured about safety and necessity of long-term PPI therapy. - Discussed dietary triggers (caffeine , citrus, tomato), confirmed caffeine  avoidance. - Advised follow-up in 6-8 weeks to assess therapy response. - Instructed to contact via MyChart if dysphagia does not improve in one month or symptoms worsen, to expedite repeat upper endoscopy and dilation if indicated.  Acute infectious gastroenteritis Resolved episode; ongoing nausea and vomiting but no ongoing diarrhea. Persistent vomiting and upper GI symptoms attributed to underlying GERD. - Reassured that acute gastroenteritis has resolved and current symptoms are consistent with GERD exacerbation. - No further diagnostic or therapeutic interventions indicated for gastroenteritis.  She is still recuperating from her recent illness there are some upper respiratory symptoms as well, I gave her a work note to return Thursday, January 16.  We reviewed whether or not to proceed with endoscopic evaluation, she prefers to hold off and given clinical course I think that is fine at this time with respect to EGD and colonoscopy though if dysphagia persists despite reinitiation  of omeprazole .  We will reassess at follow-up visit 11/13/2024.   CC: Loring Tanda Mae, MD   Subjective:   Chief Complaint: Nausea and vomiting  HPI Discussed the use of AI scribe software for clinical note transcription with the patient, who gave verbal consent to proceed.   Kelli Allison is a 59 year old female with gastroesophageal reflux disease and esophageal stricture who presents for evaluation of persistent nausea, vomiting, heartburn, and dysphagia.  She had been seen here in December by Camie Furbish PA-C and had altered bowel habits.  There was a report of a rectal mass and that is why she was referred, the patient says today that her primary care provider did not find a rectal mass but referred for evaluation.  She had felt a pressure sensation.  She was having some profound constipation at the time and that has resolved.  When she saw Ms. Heinz no mass was detected but hemorrhoids were seen.  There had been some rectal bleeding also and that has resolved.  She was scheduled for an EGD and colonoscopy in February.  Since that time as I said constipation and bleeding have resolved and she does not sense that she has a mass.  She has actually had diarrhea and nausea and vomiting as outlined below.  Gastrointestinal Symptoms: - Persistent nausea and vomiting since January 8th, following episodes of acute infectious gastroenteritis - Diarrhea resolved after initial emergency room visits on January 8th and January 11th - Severe heartburn and burning sensation in the stomach, especially after eating - Significant nocturnal acid reflux with constant stomach burning - Concern about possible recurrence of peptic ulcer disease  Dysphagia: - Difficulty swallowing food approximately three times  per week - Sensation of food hanging up in the esophagus and choking - Symptoms have been present for several months, predating recent gastrointestinal illness  Respiratory Symptoms: - Ongoing  congestion and cough  Medication History: - Omeprazole  previously discontinued due to concerns about long-term use, recently restarted at 40 mg by gastroenterology nurse - Famotidine  trialed but ineffective - No caffeine  use for over twenty years due to prior swelling in digestive system  Dental Status: - Transitioned to dentures six months ago (full upper plate, partials on lower) - Cautious with eating due to new dentures  CT chest/abdomen/pelvis 01/19/2024 IMPRESSION: 1. No CT evidence of acute traumatic injury to the chest, abdomen, or pelvis. 2. Status post cholecystectomy.   EGD 02/14/2017 - Moderate Schatzki ring. Dilated.  - LA Grade A reflux esophagitis. Biopsied.  - Non- bleeding erosive gastropathy. Biopsied.  - The examination was otherwise normal.   Colonoscopy 02/12/2017 - The perianal and digital rectal examinations were normal.  - The colon (entire examined portion) appeared normal.  - No additional abnormalities were found on retroflexion.  - The terminal ileum appeared normal. - Recall 10 years      Allergies[1] Active Medications[2] Past Medical History:  Diagnosis Date   Hearing aid worn    Hearing loss    Hypothyroidism    IBS (irritable bowel syndrome)    Kidney stones    Migraine    Obesity    Stomach ulcer due to nonsteroidal anti-inflammatory drug (NSAID)    Past Surgical History:  Procedure Laterality Date   CESAREAN SECTION     CHOLECYSTECTOMY     INNER EAR SURGERY     TUBAL LIGATION     Social History   Social History Narrative   Divorced, 2 children   Working in audiological scientist   Rare alcohol, never smoker no drug use   family history includes Alcohol abuse in her mother; Anxiety disorder in her mother; Asthma in her maternal grandfather and mother; Birth defects in her brother and son; COPD in her maternal grandfather and mother; Cancer in her paternal grandmother; Depression in her brother, brother, daughter, and mother; Diabetes in her  maternal grandmother; Drug abuse in her mother; Heart disease in her maternal grandmother; Hyperlipidemia in her maternal grandmother; Hypertension in her maternal grandmother; Kidney disease in her son; Learning disabilities in her brother and brother; Miscarriages / Stillbirths in her daughter, maternal grandmother, and mother.   Review of Systems As above  Objective:   Physical Exam @BP  100/60   Pulse 72   Ht 5' 1 (1.549 m)   Wt 181 lb 6.4 oz (82.3 kg)   BMI 34.28 kg/m @  General:  NAD Eyes:   anicteric Lungs:  clear Heart::  S1S2 no rubs, murmurs or gallops Abdomen:  soft and nontender, BS+ Ext:   no edema, cyanosis or clubbing    Data Reviewed:  As per HPI, I have reviewed recent GI notes from December 2025 as well as the January emergency department visits    [1]  Allergies Allergen Reactions   Aspirin Nausea Only   Caffeine  Other (See Comments)    Or the color in sodas-causes GI problems   Ibuprofen     Stomach ulcers    Penicillins Hives    Has patient had a PCN reaction causing immediate rash, facial/tongue/throat swelling, SOB or lightheadedness with hypotension: YES Has patient had a PCN reaction causing severe rash involving mucus membranes or skin necrosis: NO Has patient had a PCN reaction that  required hospitalization NO Has patient had a PCN reaction occurring within the last 10 years:NO If all of the above answers are NO, then may proceed with Cephalosporin use.  [2]  Current Meds  Medication Sig   cyclobenzaprine  (FLEXERIL ) 10 MG tablet Take 1 tablet (10 mg total) by mouth 2 (two) times daily as needed for up to 20 doses for muscle spasms.   dicyclomine  (BENTYL ) 20 MG tablet Take 1 tablet (20 mg total) by mouth 2 (two) times daily as needed for spasms.   famotidine  (PEPCID ) 20 MG tablet Take 1-2 tablets by mouth daily.   levothyroxine  (SYNTHROID , LEVOTHROID) 75 MCG tablet Take 1 tablet (75 mcg total) by mouth daily before breakfast.   meloxicam  (MOBIC) 15 MG tablet Take 15 mg by mouth.   Multiple Vitamin (MULTIVITAMIN) tablet Take 1 tablet by mouth daily.   neomycin-polymyxin-hydrocortisone (CORTISPORIN) 3.5-10000-1 OTIC suspension 4 drops 2 (two) times daily.   omeprazole  (PRILOSEC) 40 MG capsule Take 1 capsule (40 mg total) by mouth daily before breakfast. 30 minutes before breakfast   ondansetron  (ZOFRAN -ODT) 4 MG disintegrating tablet Take 1 tablet (4 mg total) by mouth every 8 (eight) hours as needed for nausea or vomiting.   traMADol  (ULTRAM ) 50 MG tablet Take 50 mg by mouth.   traZODone (DESYREL) 150 MG tablet Take 150 mg by mouth at bedtime.   vitamin B-12 (CYANOCOBALAMIN) 1000 MCG tablet Take 1,000 mcg by mouth daily.   Vitamin D , Ergocalciferol , (DRISDOL ) 1.25 MG (50000 UNIT) CAPS capsule Take 1 capsule (50,000 Units total) by mouth every 7 (seven) days.   "

## 2024-09-22 NOTE — Patient Instructions (Addendum)
" °  VISIT SUMMARY: During your visit, we evaluated your persistent nausea, vomiting, heartburn, and difficulty swallowing. We discussed your history of gastroesophageal reflux disease (GERD) with esophageal stricture and the recent recurrence of symptoms after stopping omeprazole .  YOUR PLAN: GASTROESOPHAGEAL REFLUX DISEASE WITH ESOPHAGEAL STRICTURE: You have chronic severe symptoms of GERD with recurrent difficulty swallowing and heartburn after stopping omeprazole . -Take omeprazole  40 mg daily as prescribed. Your prescription has been sent to the pharmacy. -We discussed the low risk of bone and kidney complications with long-term use of omeprazole . The benefits of taking this medication outweigh the risks due to your esophageal stricture. -Avoid dietary triggers such as caffeine , citrus, and tomatoes. -Follow up in 6-8 weeks to assess your response to the therapy. -Contact us  via MyChart if your difficulty swallowing does not improve in one month or if your symptoms worsen. We may need to repeat the upper endoscopy and dilation if necessary.  ACUTE INFECTIOUS GASTROENTERITIS: Your episode of acute gastroenteritis is resolving. No further diagnostic or therapeutic interventions are needed for gastroenteritis at this time.  ----------------------------------------------------------------  There is much written and talked about possible health problems with the use of PPI medications (omeprazole  (Prilosec), esomeprazole (Nexium), rabeprazole (Aciphex), lansoprazole (Prevacid), Dexlansoprazole (Dexilant) and pantoprazole  ( Protonix ).  The truth is the studies that suggest health problems with the use of PPI medications only find a weak association at best with the possible diseases reported to occur like dementia, bone fracture and kidney failure and even death. Many sicker patients with multiple health problems end up on these medications for various reasons. Establishing cause and effect in medical  research is extremely difficult and though I believe there are very rare occurrences in which PPI's do cause harm in these ways, the benefits of you taking your PPI outweigh these largely theoretical and sometimes directly disproven risks.  As always, I/we will strive to have you on the lowest effective dose and frequency of a medication to keep you well and with a good quality of life.   I appreciate the opportunity to care for you. Lupita Commander, MD, [facility]                        Contains text generated by [employer].                                 Contains text generated by [employer].   "

## 2024-10-21 ENCOUNTER — Encounter: Admitting: Internal Medicine

## 2024-10-26 ENCOUNTER — Encounter: Admitting: Internal Medicine

## 2024-11-13 ENCOUNTER — Ambulatory Visit: Admitting: Internal Medicine
